# Patient Record
Sex: Male | Born: 1950 | Race: White | Hispanic: No | Smoking: Current every day smoker
Health system: Southern US, Community
[De-identification: ages and names within clinical notes are randomized; demographics above are authoritative.]

## PROBLEM LIST (undated history)

## (undated) DIAGNOSIS — E785 Hyperlipidemia, unspecified: Secondary | ICD-10-CM

## (undated) DIAGNOSIS — M62838 Other muscle spasm: Secondary | ICD-10-CM

## (undated) DIAGNOSIS — R42 Dizziness and giddiness: Secondary | ICD-10-CM

## (undated) DIAGNOSIS — Z720 Tobacco use: Secondary | ICD-10-CM

## (undated) DIAGNOSIS — J449 Chronic obstructive pulmonary disease, unspecified: Secondary | ICD-10-CM

## (undated) DIAGNOSIS — J302 Other seasonal allergic rhinitis: Secondary | ICD-10-CM

## (undated) DIAGNOSIS — I639 Cerebral infarction, unspecified: Secondary | ICD-10-CM

## (undated) DIAGNOSIS — K219 Gastro-esophageal reflux disease without esophagitis: Secondary | ICD-10-CM

## (undated) DIAGNOSIS — I2510 Atherosclerotic heart disease of native coronary artery without angina pectoris: Secondary | ICD-10-CM

## (undated) DIAGNOSIS — IMO0001 Reserved for inherently not codable concepts without codable children: Secondary | ICD-10-CM

## (undated) DIAGNOSIS — R05 Cough: Secondary | ICD-10-CM

## (undated) DIAGNOSIS — I10 Essential (primary) hypertension: Secondary | ICD-10-CM

## (undated) DIAGNOSIS — I219 Acute myocardial infarction, unspecified: Secondary | ICD-10-CM

## (undated) DIAGNOSIS — F32A Depression, unspecified: Secondary | ICD-10-CM

## (undated) DIAGNOSIS — R600 Localized edema: Secondary | ICD-10-CM

## (undated) DIAGNOSIS — C801 Malignant (primary) neoplasm, unspecified: Secondary | ICD-10-CM

## (undated) DIAGNOSIS — F1010 Alcohol abuse, uncomplicated: Secondary | ICD-10-CM

## (undated) DIAGNOSIS — F419 Anxiety disorder, unspecified: Secondary | ICD-10-CM

## (undated) DIAGNOSIS — I739 Peripheral vascular disease, unspecified: Secondary | ICD-10-CM

## (undated) DIAGNOSIS — R059 Cough, unspecified: Secondary | ICD-10-CM

## (undated) DIAGNOSIS — F329 Major depressive disorder, single episode, unspecified: Secondary | ICD-10-CM

## (undated) HISTORY — PX: OTHER SURGICAL HISTORY: SHX169

## (undated) HISTORY — PX: CAROTID ENDARTERECTOMY: SUR193

## (undated) HISTORY — PX: CORONARY ARTERY BYPASS GRAFT: SHX141

## (undated) HISTORY — PX: CARDIAC SURGERY: SHX584

---

## 1998-02-14 ENCOUNTER — Emergency Department (HOSPITAL_COMMUNITY): Admission: EM | Admit: 1998-02-14 | Discharge: 1998-02-14 | Payer: Self-pay | Admitting: Emergency Medicine

## 1998-03-14 ENCOUNTER — Inpatient Hospital Stay (HOSPITAL_COMMUNITY): Admission: EM | Admit: 1998-03-14 | Discharge: 1998-03-22 | Payer: Self-pay | Admitting: Emergency Medicine

## 1998-03-14 ENCOUNTER — Encounter: Payer: Self-pay | Admitting: Emergency Medicine

## 1998-03-19 ENCOUNTER — Encounter: Payer: Self-pay | Admitting: Cardiothoracic Surgery

## 1998-03-20 ENCOUNTER — Encounter: Payer: Self-pay | Admitting: Cardiothoracic Surgery

## 1998-03-21 ENCOUNTER — Encounter: Payer: Self-pay | Admitting: Cardiothoracic Surgery

## 1998-04-01 ENCOUNTER — Encounter: Admission: RE | Admit: 1998-04-01 | Discharge: 1998-04-01 | Payer: Self-pay | Admitting: Sports Medicine

## 1998-05-06 ENCOUNTER — Encounter: Admission: RE | Admit: 1998-05-06 | Discharge: 1998-05-06 | Payer: Self-pay | Admitting: Sports Medicine

## 1998-05-14 ENCOUNTER — Encounter: Payer: Self-pay | Admitting: Emergency Medicine

## 1998-05-14 ENCOUNTER — Emergency Department (HOSPITAL_COMMUNITY): Admission: EM | Admit: 1998-05-14 | Discharge: 1998-05-14 | Payer: Self-pay | Admitting: Emergency Medicine

## 1998-05-19 ENCOUNTER — Emergency Department (HOSPITAL_COMMUNITY): Admission: EM | Admit: 1998-05-19 | Discharge: 1998-05-19 | Payer: Self-pay | Admitting: Emergency Medicine

## 1998-08-24 ENCOUNTER — Emergency Department (HOSPITAL_COMMUNITY): Admission: EM | Admit: 1998-08-24 | Discharge: 1998-08-24 | Payer: Self-pay | Admitting: *Deleted

## 1998-09-04 ENCOUNTER — Encounter: Admission: RE | Admit: 1998-09-04 | Discharge: 1998-09-04 | Payer: Self-pay | Admitting: Family Medicine

## 1998-10-03 ENCOUNTER — Encounter: Admission: RE | Admit: 1998-10-03 | Discharge: 1998-10-03 | Payer: Self-pay | Admitting: Family Medicine

## 1998-12-24 ENCOUNTER — Encounter: Admission: RE | Admit: 1998-12-24 | Discharge: 1998-12-24 | Payer: Self-pay | Admitting: Family Medicine

## 1999-04-06 ENCOUNTER — Encounter: Admission: RE | Admit: 1999-04-06 | Discharge: 1999-04-06 | Payer: Self-pay | Admitting: Family Medicine

## 1999-08-02 ENCOUNTER — Emergency Department (HOSPITAL_COMMUNITY): Admission: EM | Admit: 1999-08-02 | Discharge: 1999-08-02 | Payer: Self-pay | Admitting: Emergency Medicine

## 1999-08-02 ENCOUNTER — Encounter: Payer: Self-pay | Admitting: Emergency Medicine

## 2001-01-09 ENCOUNTER — Emergency Department (HOSPITAL_COMMUNITY): Admission: EM | Admit: 2001-01-09 | Discharge: 2001-01-09 | Payer: Self-pay | Admitting: Emergency Medicine

## 2002-01-23 ENCOUNTER — Encounter (INDEPENDENT_AMBULATORY_CARE_PROVIDER_SITE_OTHER): Payer: Self-pay | Admitting: Specialist

## 2002-01-23 ENCOUNTER — Encounter: Payer: Self-pay | Admitting: Emergency Medicine

## 2002-01-23 ENCOUNTER — Inpatient Hospital Stay (HOSPITAL_COMMUNITY): Admission: EM | Admit: 2002-01-23 | Discharge: 2002-01-30 | Payer: Self-pay | Admitting: Emergency Medicine

## 2002-01-23 ENCOUNTER — Encounter: Payer: Self-pay | Admitting: Family Medicine

## 2002-01-24 ENCOUNTER — Encounter (INDEPENDENT_AMBULATORY_CARE_PROVIDER_SITE_OTHER): Payer: Self-pay | Admitting: Cardiology

## 2002-07-09 ENCOUNTER — Emergency Department (HOSPITAL_COMMUNITY): Admission: EM | Admit: 2002-07-09 | Discharge: 2002-07-09 | Payer: Self-pay | Admitting: *Deleted

## 2003-10-26 ENCOUNTER — Ambulatory Visit (HOSPITAL_COMMUNITY): Admission: RE | Admit: 2003-10-26 | Discharge: 2003-10-26 | Payer: Self-pay | Admitting: Family Medicine

## 2004-03-03 ENCOUNTER — Emergency Department (HOSPITAL_COMMUNITY): Admission: EM | Admit: 2004-03-03 | Discharge: 2004-03-03 | Payer: Self-pay | Admitting: Emergency Medicine

## 2004-12-05 ENCOUNTER — Emergency Department (HOSPITAL_COMMUNITY): Admission: EM | Admit: 2004-12-05 | Discharge: 2004-12-05 | Payer: Self-pay | Admitting: Emergency Medicine

## 2005-04-21 ENCOUNTER — Emergency Department (HOSPITAL_COMMUNITY): Admission: EM | Admit: 2005-04-21 | Discharge: 2005-04-21 | Payer: Self-pay | Admitting: Emergency Medicine

## 2005-06-12 ENCOUNTER — Emergency Department (HOSPITAL_COMMUNITY): Admission: EM | Admit: 2005-06-12 | Discharge: 2005-06-12 | Payer: Self-pay | Admitting: Emergency Medicine

## 2005-10-06 ENCOUNTER — Encounter: Payer: Self-pay | Admitting: Family Medicine

## 2005-10-06 ENCOUNTER — Encounter: Payer: Self-pay | Admitting: Cardiothoracic Surgery

## 2005-10-06 ENCOUNTER — Encounter: Payer: Self-pay | Admitting: Cardiology

## 2005-12-16 ENCOUNTER — Inpatient Hospital Stay (HOSPITAL_COMMUNITY): Admission: EM | Admit: 2005-12-16 | Discharge: 2005-12-17 | Payer: Self-pay | Admitting: Internal Medicine

## 2006-05-28 ENCOUNTER — Emergency Department (HOSPITAL_COMMUNITY): Admission: EM | Admit: 2006-05-28 | Discharge: 2006-05-28 | Payer: Self-pay | Admitting: Emergency Medicine

## 2006-07-10 ENCOUNTER — Emergency Department (HOSPITAL_COMMUNITY): Admission: EM | Admit: 2006-07-10 | Discharge: 2006-07-11 | Payer: Self-pay | Admitting: Emergency Medicine

## 2007-12-15 ENCOUNTER — Emergency Department (HOSPITAL_COMMUNITY): Admission: EM | Admit: 2007-12-15 | Discharge: 2007-12-15 | Payer: Self-pay | Admitting: Emergency Medicine

## 2007-12-18 ENCOUNTER — Inpatient Hospital Stay (HOSPITAL_COMMUNITY): Admission: EM | Admit: 2007-12-18 | Discharge: 2007-12-21 | Payer: Self-pay | Admitting: Emergency Medicine

## 2007-12-19 ENCOUNTER — Encounter (INDEPENDENT_AMBULATORY_CARE_PROVIDER_SITE_OTHER): Payer: Self-pay | Admitting: Internal Medicine

## 2007-12-19 ENCOUNTER — Ambulatory Visit: Payer: Self-pay | Admitting: Vascular Surgery

## 2007-12-20 ENCOUNTER — Ambulatory Visit: Payer: Self-pay | Admitting: Physical Medicine & Rehabilitation

## 2007-12-21 ENCOUNTER — Ambulatory Visit: Payer: Self-pay | Admitting: Physical Medicine & Rehabilitation

## 2007-12-21 ENCOUNTER — Inpatient Hospital Stay (HOSPITAL_COMMUNITY)
Admission: RE | Admit: 2007-12-21 | Discharge: 2008-01-09 | Payer: Self-pay | Admitting: Physical Medicine & Rehabilitation

## 2008-02-02 ENCOUNTER — Emergency Department (HOSPITAL_COMMUNITY): Admission: EM | Admit: 2008-02-02 | Discharge: 2008-02-02 | Payer: Self-pay | Admitting: Emergency Medicine

## 2008-02-19 ENCOUNTER — Encounter
Admission: RE | Admit: 2008-02-19 | Discharge: 2008-03-18 | Payer: Self-pay | Admitting: Physical Medicine & Rehabilitation

## 2008-03-13 ENCOUNTER — Encounter
Admission: RE | Admit: 2008-03-13 | Discharge: 2008-03-14 | Payer: Self-pay | Admitting: Physical Medicine & Rehabilitation

## 2008-03-14 ENCOUNTER — Ambulatory Visit: Payer: Self-pay | Admitting: Physical Medicine & Rehabilitation

## 2008-04-30 ENCOUNTER — Emergency Department (HOSPITAL_COMMUNITY): Admission: EM | Admit: 2008-04-30 | Discharge: 2008-04-30 | Payer: Self-pay

## 2008-05-01 ENCOUNTER — Emergency Department (HOSPITAL_COMMUNITY): Admission: EM | Admit: 2008-05-01 | Discharge: 2008-05-01 | Payer: Self-pay | Admitting: Emergency Medicine

## 2008-05-02 ENCOUNTER — Other Ambulatory Visit: Payer: Self-pay

## 2008-05-02 ENCOUNTER — Other Ambulatory Visit: Payer: Self-pay | Admitting: Emergency Medicine

## 2008-05-03 ENCOUNTER — Inpatient Hospital Stay (HOSPITAL_COMMUNITY): Admission: RE | Admit: 2008-05-03 | Discharge: 2008-05-10 | Payer: Self-pay | Admitting: Psychiatry

## 2008-05-03 ENCOUNTER — Ambulatory Visit: Payer: Self-pay | Admitting: Psychiatry

## 2008-06-09 ENCOUNTER — Emergency Department (HOSPITAL_COMMUNITY): Admission: EM | Admit: 2008-06-09 | Discharge: 2008-06-09 | Payer: Self-pay | Admitting: Emergency Medicine

## 2008-07-07 ENCOUNTER — Emergency Department (HOSPITAL_COMMUNITY): Admission: EM | Admit: 2008-07-07 | Discharge: 2008-07-07 | Payer: Self-pay | Admitting: Emergency Medicine

## 2008-11-15 ENCOUNTER — Emergency Department (HOSPITAL_BASED_OUTPATIENT_CLINIC_OR_DEPARTMENT_OTHER): Admission: EM | Admit: 2008-11-15 | Discharge: 2008-11-15 | Payer: Self-pay | Admitting: Emergency Medicine

## 2008-11-15 ENCOUNTER — Ambulatory Visit: Payer: Self-pay | Admitting: Diagnostic Radiology

## 2008-12-12 ENCOUNTER — Ambulatory Visit: Payer: Self-pay | Admitting: Diagnostic Radiology

## 2008-12-12 ENCOUNTER — Encounter: Payer: Self-pay | Admitting: Emergency Medicine

## 2008-12-13 ENCOUNTER — Ambulatory Visit: Payer: Self-pay | Admitting: Family Medicine

## 2008-12-13 ENCOUNTER — Observation Stay (HOSPITAL_COMMUNITY): Admission: EM | Admit: 2008-12-13 | Discharge: 2008-12-16 | Payer: Self-pay | Admitting: Family Medicine

## 2008-12-17 ENCOUNTER — Inpatient Hospital Stay (HOSPITAL_COMMUNITY): Admission: RE | Admit: 2008-12-17 | Discharge: 2008-12-23 | Payer: Self-pay | Admitting: Psychiatry

## 2008-12-17 ENCOUNTER — Ambulatory Visit: Payer: Self-pay | Admitting: Psychiatry

## 2009-03-18 ENCOUNTER — Inpatient Hospital Stay (HOSPITAL_COMMUNITY): Admission: EM | Admit: 2009-03-18 | Discharge: 2009-03-23 | Payer: Self-pay | Admitting: Emergency Medicine

## 2009-03-22 ENCOUNTER — Ambulatory Visit: Payer: Self-pay | Admitting: Psychiatry

## 2009-03-23 ENCOUNTER — Inpatient Hospital Stay (HOSPITAL_COMMUNITY): Admission: RE | Admit: 2009-03-23 | Discharge: 2009-03-26 | Payer: Self-pay | Admitting: Psychiatry

## 2010-06-28 ENCOUNTER — Encounter: Payer: Self-pay | Admitting: Internal Medicine

## 2010-09-10 LAB — CBC
HCT: 40.8 % (ref 39.0–52.0)
HCT: 41.5 % (ref 39.0–52.0)
HCT: 44.1 % (ref 39.0–52.0)
Hemoglobin: 14 g/dL (ref 13.0–17.0)
Hemoglobin: 14.3 g/dL (ref 13.0–17.0)
Platelets: 239 10*3/uL (ref 150–400)
Platelets: 266 10*3/uL (ref 150–400)
RBC: 4.2 MIL/uL — ABNORMAL LOW (ref 4.22–5.81)
RDW: 14.1 % (ref 11.5–15.5)
WBC: 5.9 10*3/uL (ref 4.0–10.5)
WBC: 6.1 10*3/uL (ref 4.0–10.5)

## 2010-09-10 LAB — GLUCOSE, CAPILLARY
Glucose-Capillary: 101 mg/dL — ABNORMAL HIGH (ref 70–99)
Glucose-Capillary: 120 mg/dL — ABNORMAL HIGH (ref 70–99)
Glucose-Capillary: 138 mg/dL — ABNORMAL HIGH (ref 70–99)
Glucose-Capillary: 157 mg/dL — ABNORMAL HIGH (ref 70–99)
Glucose-Capillary: 106 mg/dL — ABNORMAL HIGH (ref 70–99)
Glucose-Capillary: 131 mg/dL — ABNORMAL HIGH (ref 70–99)
Glucose-Capillary: 135 mg/dL — ABNORMAL HIGH (ref 70–99)
Glucose-Capillary: 182 mg/dL — ABNORMAL HIGH (ref 70–99)
Glucose-Capillary: 112 mg/dL — ABNORMAL HIGH (ref 70–99)
Glucose-Capillary: 116 mg/dL — ABNORMAL HIGH (ref 70–99)
Glucose-Capillary: 140 mg/dL — ABNORMAL HIGH (ref 70–99)
Glucose-Capillary: 98 mg/dL (ref 70–99)
Glucose-Capillary: 99 mg/dL (ref 70–99)

## 2010-09-10 LAB — DIFFERENTIAL
Basophils Relative: 1 % (ref 0–1)
Basophils Relative: 1 % (ref 0–1)
Eosinophils Relative: 2 % (ref 0–5)
Lymphocytes Relative: 35 % (ref 12–46)
Monocytes Absolute: 0.5 10*3/uL (ref 0.1–1.0)
Monocytes Absolute: 0.5 10*3/uL (ref 0.1–1.0)
Monocytes Relative: 7 % (ref 3–12)
Monocytes Relative: 8 % (ref 3–12)
Neutro Abs: 3.7 10*3/uL (ref 1.7–7.7)
Neutro Abs: 4.3 10*3/uL (ref 1.7–7.7)

## 2010-09-10 LAB — LIPID PANEL
Cholesterol: 165 mg/dL (ref 0–200)
HDL: 27 mg/dL — ABNORMAL LOW (ref >39)
Triglycerides: 553 mg/dL — ABNORMAL HIGH (ref <150)

## 2010-09-10 LAB — RAPID URINE DRUG SCREEN, HOSP PERFORMED
Amphetamines: NOT DETECTED
Tetrahydrocannabinol: NOT DETECTED

## 2010-09-10 LAB — BASIC METABOLIC PANEL
BUN: 7 mg/dL (ref 6–23)
CO2: 29 mEq/L (ref 19–32)
CO2: 33 mEq/L — ABNORMAL HIGH (ref 19–32)
GFR calc non Af Amer: >60 mL/min (ref >60)
GFR calc non Af Amer: >60 mL/min (ref >60)
Glucose, Bld: 105 mg/dL — ABNORMAL HIGH (ref 70–99)
Glucose, Bld: 116 mg/dL — ABNORMAL HIGH (ref 70–99)
Potassium: 3.9 mEq/L (ref 3.5–5.1)
Potassium: 4.2 mEq/L (ref 3.5–5.1)
Sodium: 139 mEq/L (ref 135–145)

## 2010-09-10 LAB — URINALYSIS, DIPSTICK ONLY
Glucose, UA: NEGATIVE mg/dL
Glucose, UA: NEGATIVE mg/dL
Hgb urine dipstick: NEGATIVE
Leukocytes, UA: NEGATIVE
Protein, ur: NEGATIVE mg/dL
Specific Gravity, Urine: 1.009 (ref 1.005–1.030)
Specific Gravity, Urine: 1.014 (ref 1.005–1.030)
pH: 5 (ref 5.0–8.0)
pH: 5 (ref 5.0–8.0)

## 2010-09-10 LAB — COMPREHENSIVE METABOLIC PANEL
AST: 20 U/L (ref 0–37)
AST: 20 U/L (ref 0–37)
Albumin: 3.7 g/dL (ref 3.5–5.2)
Albumin: 3.8 g/dL (ref 3.5–5.2)
Alkaline Phosphatase: 70 U/L (ref 39–117)
BUN: 10 mg/dL (ref 6–23)
Chloride: 98 mEq/L (ref 96–112)
Creatinine, Ser: 0.88 mg/dL (ref 0.4–1.5)
GFR calc Af Amer: >60 mL/min (ref >60)
GFR calc Af Amer: >60 mL/min (ref >60)
Potassium: 3.7 mEq/L (ref 3.5–5.1)
Total Bilirubin: 0.5 mg/dL (ref 0.3–1.2)
Total Protein: 7.3 g/dL (ref 6.0–8.3)

## 2010-09-10 LAB — URINALYSIS, ROUTINE W REFLEX MICROSCOPIC
Hgb urine dipstick: NEGATIVE
Nitrite: NEGATIVE
Protein, ur: NEGATIVE mg/dL
Specific Gravity, Urine: 1.01 (ref 1.005–1.030)
Urobilinogen, UA: 0.2 mg/dL (ref 0.0–1.0)

## 2010-09-10 LAB — CK TOTAL AND CKMB (NOT AT ARMC): Relative Index: 1.1 (ref 0.0–2.5)

## 2010-09-10 LAB — TRICYCLICS SCREEN, URINE: TCA Scrn: NOT DETECTED

## 2010-09-10 LAB — BLOOD GAS, ARTERIAL
Bicarbonate: 23.5 mEq/L (ref 20.0–24.0)
FIO2: 0.21 %
pH, Arterial: 7.37 (ref 7.350–7.450)
pO2, Arterial: 68 mmHg — ABNORMAL LOW (ref 80.0–100.0)

## 2010-09-10 LAB — TSH: TSH: 0.434 u[IU]/mL (ref 0.350–4.500)

## 2010-09-10 LAB — CARDIAC PANEL(CRET KIN+CKTOT+MB+TROPI)
CK, MB: 2 ng/mL (ref 0.3–4.0)
Relative Index: 0.7 (ref 0.0–2.5)
Relative Index: 0.8 (ref 0.0–2.5)

## 2010-09-10 LAB — SALICYLATE LEVEL
Salicylate Lvl: 20 mg/dL (ref 2.8–20.0)
Salicylate Lvl: 30.3 mg/dL (ref 2.8–20.0)
Salicylate Lvl: 40.9 mg/dL (ref 2.8–20.0)
Salicylate Lvl: 51 mg/dL (ref 2.8–20.0)

## 2010-09-10 LAB — ETHANOL: Alcohol, Ethyl (B): 92 mg/dL — ABNORMAL HIGH (ref 0–10)

## 2010-09-13 LAB — GLUCOSE, CAPILLARY
Glucose-Capillary: 100 mg/dL — ABNORMAL HIGH (ref 70–99)
Glucose-Capillary: 102 mg/dL — ABNORMAL HIGH (ref 70–99)
Glucose-Capillary: 104 mg/dL — ABNORMAL HIGH (ref 70–99)
Glucose-Capillary: 108 mg/dL — ABNORMAL HIGH (ref 70–99)
Glucose-Capillary: 129 mg/dL — ABNORMAL HIGH (ref 70–99)
Glucose-Capillary: 140 mg/dL — ABNORMAL HIGH (ref 70–99)
Glucose-Capillary: 141 mg/dL — ABNORMAL HIGH (ref 70–99)
Glucose-Capillary: 216 mg/dL — ABNORMAL HIGH (ref 70–99)
Glucose-Capillary: 103 mg/dL — ABNORMAL HIGH (ref 70–99)
Glucose-Capillary: 106 mg/dL — ABNORMAL HIGH (ref 70–99)
Glucose-Capillary: 113 mg/dL — ABNORMAL HIGH (ref 70–99)
Glucose-Capillary: 119 mg/dL — ABNORMAL HIGH (ref 70–99)
Glucose-Capillary: 128 mg/dL — ABNORMAL HIGH (ref 70–99)
Glucose-Capillary: 164 mg/dL — ABNORMAL HIGH (ref 70–99)
Glucose-Capillary: 99 mg/dL (ref 70–99)
Glucose-Capillary: 82 mg/dL (ref 70–99)

## 2010-09-13 LAB — URINALYSIS, ROUTINE W REFLEX MICROSCOPIC
Glucose, UA: NEGATIVE mg/dL
Hgb urine dipstick: NEGATIVE
Ketones, ur: 15 mg/dL — AB
Nitrite: NEGATIVE
Protein, ur: NEGATIVE mg/dL
Specific Gravity, Urine: 1.028 (ref 1.005–1.030)
Urobilinogen, UA: 1 mg/dL (ref 0.0–1.0)
pH: 5 (ref 5.0–8.0)

## 2010-09-13 LAB — CBC
HCT: 43 % (ref 39.0–52.0)
Hemoglobin: 14.3 g/dL (ref 13.0–17.0)
MCHC: 33.4 g/dL (ref 30.0–36.0)
MCV: 95.8 fL (ref 78.0–100.0)
Platelets: 223 10*3/uL (ref 150–400)
RBC: 4.48 MIL/uL (ref 4.22–5.81)
RDW: 12.3 % (ref 11.5–15.5)
WBC: 7.6 10*3/uL (ref 4.0–10.5)

## 2010-09-13 LAB — POCT TOXICOLOGY PANEL
Barbiturates: POSITIVE
Benzodiazepines: POSITIVE

## 2010-09-13 LAB — POCT CARDIAC MARKERS
CKMB, poc: 8.1 ng/mL (ref 1.0–8.0)
Myoglobin, poc: 350 ng/mL (ref 12–200)
Troponin i, poc: <0.05 ng/mL (ref 0.00–0.09)

## 2010-09-13 LAB — CK TOTAL AND CKMB (NOT AT ARMC)
CK, MB: 14.2 ng/mL — ABNORMAL HIGH (ref 0.3–4.0)
Relative Index: 3.5 — ABNORMAL HIGH (ref 0.0–2.5)
Total CK: 411 U/L — ABNORMAL HIGH (ref 7–232)

## 2010-09-13 LAB — URINE CULTURE: Culture: NO GROWTH

## 2010-09-13 LAB — COMPREHENSIVE METABOLIC PANEL
Albumin: 3.9 g/dL (ref 3.5–5.2)
Alkaline Phosphatase: 84 U/L (ref 39–117)
BUN: 20 mg/dL (ref 6–23)
Potassium: 4.7 mEq/L (ref 3.5–5.1)
Sodium: 134 mEq/L — ABNORMAL LOW (ref 135–145)
Total Protein: 7 g/dL (ref 6.0–8.3)

## 2010-09-13 LAB — ETHANOL: Alcohol, Ethyl (B): <5 mg/dL (ref 0–10)

## 2010-09-14 LAB — CARDIAC PANEL(CRET KIN+CKTOT+MB+TROPI)
CK, MB: 12.5 ng/mL — ABNORMAL HIGH (ref 0.3–4.0)
CK, MB: 8.6 ng/mL — ABNORMAL HIGH (ref 0.3–4.0)
Relative Index: 2.8 — ABNORMAL HIGH (ref 0.0–2.5)
Total CK: 419 U/L — ABNORMAL HIGH (ref 7–232)
Troponin I: 0.02 ng/mL (ref 0.00–0.06)
Troponin I: 0.02 ng/mL (ref 0.00–0.06)
Troponin I: 0.02 ng/mL (ref 0.00–0.06)

## 2010-09-14 LAB — CBC
HCT: 46.8 % (ref 39.0–52.0)
Hemoglobin: 15.9 g/dL (ref 13.0–17.0)
Platelets: 134 10*3/uL — ABNORMAL LOW (ref 150–400)
RDW: 13.6 % (ref 11.5–15.5)
WBC: 4.8 10*3/uL (ref 4.0–10.5)

## 2010-09-14 LAB — POCT TOXICOLOGY PANEL: Benzodiazepines: POSITIVE

## 2010-09-14 LAB — GLUCOSE, CAPILLARY
Glucose-Capillary: 102 mg/dL — ABNORMAL HIGH (ref 70–99)
Glucose-Capillary: 105 mg/dL — ABNORMAL HIGH (ref 70–99)
Glucose-Capillary: 106 mg/dL — ABNORMAL HIGH (ref 70–99)
Glucose-Capillary: 113 mg/dL — ABNORMAL HIGH (ref 70–99)
Glucose-Capillary: 149 mg/dL — ABNORMAL HIGH (ref 70–99)
Glucose-Capillary: 156 mg/dL — ABNORMAL HIGH (ref 70–99)
Glucose-Capillary: 86 mg/dL (ref 70–99)
Glucose-Capillary: 89 mg/dL (ref 70–99)
Glucose-Capillary: 91 mg/dL (ref 70–99)
Glucose-Capillary: 91 mg/dL (ref 70–99)

## 2010-09-14 LAB — URINALYSIS, ROUTINE W REFLEX MICROSCOPIC
Glucose, UA: NEGATIVE mg/dL
Nitrite: NEGATIVE
Specific Gravity, Urine: 1.007 (ref 1.005–1.030)
pH: 5.5 (ref 5.0–8.0)

## 2010-09-14 LAB — DIFFERENTIAL
Basophils Absolute: 0 10*3/uL (ref 0.0–0.1)
Lymphocytes Relative: 27 % (ref 12–46)
Lymphs Abs: 1.3 10*3/uL (ref 0.7–4.0)
Neutro Abs: 3.1 10*3/uL (ref 1.7–7.7)

## 2010-09-14 LAB — BASIC METABOLIC PANEL
BUN: 16 mg/dL (ref 6–23)
CO2: 24 mEq/L (ref 19–32)
Calcium: 9.2 mg/dL (ref 8.4–10.5)
Chloride: 103 mEq/L (ref 96–112)
GFR calc Af Amer: >60 mL/min (ref >60)
GFR calc non Af Amer: >60 mL/min (ref >60)
Glucose, Bld: 80 mg/dL (ref 70–99)
Potassium: 3.6 mEq/L (ref 3.5–5.1)
Potassium: 3.8 mEq/L (ref 3.5–5.1)
Sodium: 139 mEq/L (ref 135–145)
Sodium: 140 mEq/L (ref 135–145)

## 2010-09-14 LAB — HEMOGLOBIN A1C
Hgb A1c MFr Bld: 5.1 % (ref 4.6–6.1)
Mean Plasma Glucose: 100 mg/dL

## 2010-09-14 LAB — AMMONIA: Ammonia: 40 umol/L — ABNORMAL HIGH (ref 11–35)

## 2010-09-14 LAB — ETHANOL: Alcohol, Ethyl (B): <5 mg/dL (ref 0–10)

## 2010-09-21 LAB — DIFFERENTIAL
Basophils Relative: 0 % (ref 0–1)
Eosinophils Relative: 4 % (ref 0–5)
Lymphs Abs: 1.7 10*3/uL (ref 0.7–4.0)
Monocytes Absolute: 0.4 10*3/uL (ref 0.1–1.0)
Monocytes Relative: 10 % (ref 3–12)
Monocytes Relative: 8 % (ref 3–12)
Neutro Abs: 2.6 10*3/uL (ref 1.7–7.7)
Neutro Abs: 4.1 10*3/uL (ref 1.7–7.7)
Neutrophils Relative %: 62 % (ref 43–77)

## 2010-09-21 LAB — URINALYSIS, ROUTINE W REFLEX MICROSCOPIC
Protein, ur: NEGATIVE mg/dL
Urobilinogen, UA: 1 mg/dL (ref 0.0–1.0)

## 2010-09-21 LAB — URINE CULTURE

## 2010-09-21 LAB — CBC
HCT: 47 % (ref 39.0–52.0)
HCT: 48.3 % (ref 39.0–52.0)
Hemoglobin: 15.7 g/dL (ref 13.0–17.0)
Hemoglobin: 16.3 g/dL (ref 13.0–17.0)
MCHC: 33.5 g/dL (ref 30.0–36.0)
MCHC: 33.6 g/dL (ref 30.0–36.0)
RBC: 4.94 MIL/uL (ref 4.22–5.81)
RDW: 13 % (ref 11.5–15.5)

## 2010-09-21 LAB — COMPREHENSIVE METABOLIC PANEL
BUN: 10 mg/dL (ref 6–23)
Calcium: 10 mg/dL (ref 8.4–10.5)
Glucose, Bld: 100 mg/dL — ABNORMAL HIGH (ref 70–99)
Sodium: 140 mEq/L (ref 135–145)
Total Protein: 6.9 g/dL (ref 6.0–8.3)

## 2010-09-21 LAB — BASIC METABOLIC PANEL
CO2: 21 mEq/L (ref 19–32)
Calcium: 8.6 mg/dL (ref 8.4–10.5)
Chloride: 94 mEq/L — ABNORMAL LOW (ref 96–112)
GFR calc Af Amer: >60 mL/min (ref >60)
Potassium: 3.7 mEq/L (ref 3.5–5.1)
Sodium: 131 mEq/L — ABNORMAL LOW (ref 135–145)

## 2010-09-21 LAB — POCT CARDIAC MARKERS
CKMB, poc: <1 ng/mL — ABNORMAL LOW (ref 1.0–8.0)
Myoglobin, poc: 34.4 ng/mL (ref 12–200)
Troponin i, poc: <0.05 ng/mL (ref 0.00–0.09)

## 2010-10-20 NOTE — Discharge Summary (Signed)
NAME:  Melvin, [name] NO.:  [id number]   MEDICAL RECORD NO.:  [id number]          PATIENT TYPE:  IPS   LOCATION:  4030                         FACILITY:  [facility]   PHYSICIAN:  Ellwood Dense, M.D.   DATE OF BIRTH:  05-28-1951   DATE OF ADMISSION:  12/21/2007  DATE OF DISCHARGE:  01/09/2008                               DISCHARGE SUMMARY   DISCHARGE DIAGNOSES:  1. Left middle cerebral artery infarct.  2. Hypertension.  3. History of alcohol abuse.  4. Anxiety disorder.  5. History of depression.  6. Polycythemia.   HISTORY OF PRESENT ILLNESS:  [name] is a 60 year old male with  history of coronary artery disease, CVA in 2003 with minimal right  hemiparesis residually.  He was admitted on December 18, 2007, with 4-day  history of worsening right-sided weakness and difficulty with mobility.  MRI, MRA of brain showed acute infarct left MCA affecting left parietal,  left frontal, left caudate nucleus, right ICA occlusion, and severe  stenosis in left ICA with severe intracranial atherosclerosis.  A 2-D  echo showed moderate dilatation of right atrium, no thrombus, EF of 55-  65%.  Carotid Doppler showed right severe calcific plaque and mild left  ICA calcific plaque.  Plavix was added for CVA prophylaxis.  The patient  was noted to have issues with uncontrolled blood sugars and Lantus was  initiated.  Hemoglobin A1c was noted be at 8.8 at admission.  The  patient was also noted to have dyslipidemia with cholesterol of 193,  triglycerides 461, HDL 21, and LDL incalculable.  Homocystine levels  were noted to be at 11.0.  The patient continued with right lower  greater than right upper extremity weakness and decreased sensation in  the right lower extremity and facial weakness.  Therapies were  initiated, and the patient was noted to have impairments in mobility and  self-care and rehab consult for further therapies.   PAST MEDICAL HISTORY:  Significant for:  1.  Coronary artery disease with CABG in 1999.  2. Dyslipidemia.  3. Anxiety disorder.  4. Depression.  5. Left CVA with mild right hemiparesis in 2003.  6. History of polycythemia.   ALLERGIES:  PENICILLIN.   FAMILY HISTORY:  Positive for coronary artery disease.   SOCIAL HISTORY:  The patient is widowed, lives with brother in one-level  home with two steps at entry.  He is disabled secondary to coronary  artery disease.  He has 1-pack per day tobacco use history.  Alcohol  reportedly 12 beers a day.   FUNCTIONAL HISTORY:  The patient was independent without assistive  device prior to admission.   FUNCTIONAL STATUS:  The patient was requiring max assist to transfer  with verbal cues for safety and sequencing, nonambulatory secondary to  right lower extremity weakness.  Min to max assist for ADLs.  Able to  use right upper extremity grossly.  Speech therapy evaluation showed  mild-to-moderate dysarthria with mild decrease in attention.   PHYSICAL EXAMINATION AT ADMISSION:  GENERAL:  Centrally obese male, in  no acute distress.  HEENT:  Head was normocephalic and  normotraumatic.  Extraocular  movements intact.  Nares patent.  Tongue midline.  CARDIOVASCULAR:  Regular rate rhythm without murmurs.  LUNGS:  Clear to auscultation bilaterally.  ABDOMEN:  Slightly obese, soft, and nontender with positive bowel  sounds.  NEUROLOGIC:  The patient was alert and oriented x3.  Cranial nerve exam  showed slight facial weakness on the right.  Tongue midline.  Left upper  and left lower extremity strength were 5/5.  Sensation was intact to  light touch in the left arm and left leg.  The right upper extremity  strength was generally -3 to 3+/5 with decreased coordination movement.  Right lower extremity strength was generally 4-/5 with decreased  coordination movement.  The patient was able to follow 1-2 step command  without difficulty.  He was noted to have decreased sensation in the  right  face.   HOSPITAL COURSE:  [name] was admitted to rehab on December 21, 2007, for inpatient therapies to consist of PT, OT, and speech therapy  at least 3 hours a day on average of 5 days a week.  During this stay,  his blood pressures were monitored on b.i.d. basis with close monitoring  for an adjustment of meds for control.  Blood pressures at the time of  discharge ranging from 140s overall to a rare high in 160 systolic and  60s-70s diastolic.  Heart rate stable in 50s-60s range.  Rehab RN has  been following the patient for pressure relief measures as well as  toileting to maintain continence for bowel and bladder training.  The  patient has had UA and UC checked past admission that was negative.  Labs checked revealed hemoglobin 17.8, hematocrit 50.9, white count is  7.1, and platelets 173.  Check of lytes revealed sodium 138, potassium  3.7, chloride 100, CO2 of 30, BUN 5, creatinine 0.7, and glucose 116.  LFTs showed SGOT 23, SGPT 18, alkaline phos 85, T bili 1.2, total  protein 6.2 and albumin 3.3.  The patient reported issues with anxiety  as well as problems with insomnia.  Initially, trazodone was added to  help with his sleep.  He had problems with this, Ambien was added  additionally.  With improvement in the patient's mobility, Ambien was  tapered off and currently the patient continues on trazodone alone  nightly with good sleep hygiene.  The patient has also had issues with  Paxil.  There was a question of issues regarding alcohol withdrawal,  elevated levels of anxiety, and some sundowning of __________ past  admission.  He was started on Ativan tape and on Xanax 1 mg p.o. t.i.d.,  this was increased to 1 mg p.o. b.i.d., with initial indication to  decrease to one per day and taper off.  The patient, however, reports  increased anxiety levels with decreased dose of Xanax and increased  anxiety levels, especially at home without Xanax.  He is encouraged to  keep  his Xanax at 1 pill b.i.d. and to discuss issues regarding addition  of another medicines as an antianxiolytic with his primary care  physician past discharge.  Currently, nicotine patch is being used for  to help assist with tobacco cessation and withdrawal symptoms.  He is to  decrease it to 14 mg daily for 3 weeks, then to 7 mg daily for 3 weeks,  and then stop.  Issues regarding alcohol cessation as well as tobacco  cessation and its impact on health were discussed both with the patient  and his family.  The patient was maintained on Lopid for dyslipidemia  during this stay.  The patient's blood sugars were checked on a.c. h.s.  basis during this stay.  He has been weaned off Lantus currently blood  sugars ranging from 80s to 140s, on Glucophage 1000 mg b.i.d.   PT, OT, speech therapy has been ongoing during this stay.  At the time  of admission, the patient was at 80% accurate for basic factual  information.  He was noted to be intimately distracted during  conversation with mild dysarthria.  Reading comprehension for paragraph  level was at 75% accuracy.  He was noted to require min cueing to  elaborate for problem solving at higher level.  At the time of  discharge, the patient's basic as well as complex level comprehension  was at 100% accuracy.  Verbal expression was 100% intelligible without  any evidence of distraction.  Reading comprehension at sentence as well  as paragraph level was within functional limits.  The patient was able  to write without difficulty.  He was able to pay attention in mild-to-  moderate distracting environment without difficulty.  He was independent  for his safety need with good awareness of his deficits.  Memory was  intact.  Executive functioning was within functional limits.  The  patient was able to reason for safety and planning to compensate for his  deficits.  At the time of admission, the patient was at [facility] assist for  mobility.  He required  total assist  +2 to ambulate 50 feet at 50% with  increase in right knee flexion and decrease in the step length.  At the  time of discharge, the patient had progressed along to being at  supervision level overall for transfers and ambulation.  Family  education was completed with the patient's daughter as well as brother  regarding safety education and to help cue patient for his occasional  right inattention.  In terms of OT, the patient had progressed along  from requiring mod assist for pericare with cueing to and assist to help  compensate for his lean to the right as well as focusing on right upper  extremity and awareness for self-care needs.  The patient was at steady  assist for shower transfers, mod independent level for sit-to-stand with  no upper extremity support.  He showed improved awareness of right upper  extremity with some ataxia with movement still present, he had increase  in proprioception and increase in sensation.  He was a modified  independent for basic squat and stand pivot transfer to wheelchair,  toilet, as well as bed.  He could ambulate to bathroom with steady  assist with safe use of rolling walker.  The family education is  completed with daughter and brother, will provide supervision past  discharge.  The patient will continue with further followup home health  PT, OT by [facility] past discharge.  On November 09, 2007, the  patient is discharged to home.   DISCHARGE MEDICATIONS:  1. Plavix 75 mg p.o. per day.  2. Foltx 1 p.o. per day.  3. Multivitamin 1 per day.  4. Paxil 20 mg a day.  5. Trazodone 50 mg nightly.  6. Xanax 1 mg p.o. b.i.d.  7. Lopressor 25 mg half p.o. b.i.d.  8. Lopid 600 mg b.i.d.  9. Coated aspirin 325 mg a day.  10.Altace 2.5 mg b.i.d.  11.Glucophage 1000 mg b.i.d.  12.Nicotine patch 15 mg, change  daily for 3 weeks and decrease to 7 mg      daily for 3 weeks, and then stop.   DIET:  Diabetic diet.   ACTIVITY LEVEL:   Intermittent supervision.  No alcohol, no smoking, and  no driving.  Walk with assistance.   SPECIAL INSTRUCTIONS:  Check blood sugars twice a day and record.  Absolutely no smoking with patch on.  Do not use glipizide or metoprolol  100 mg.  [facility] to provide PT and OT as home health  therapies.   FOLLOWUP:  The patient to follow up with Dr. Thomasena Edis on March 14, 2008,  at 11 a.m.  Follow up with Dr. Milus Glazier on February 01, 2008, at 10 a.m.       Greg Cutter, P.A.       ______________________________  Ellwood Dense, M.D.    PP/MEDQ  D:  01/09/2008  T:  01/10/2008  Job:  [id number]   cc:   Elvina Sidle, M.D.

## 2010-10-20 NOTE — Discharge Summary (Signed)
NAME:  Melvin, Krueger NO.:  [id number]   MEDICAL RECORD NO.:  [id number]          PATIENT TYPE:  INP   LOCATION:  3705                         FACILITY:  [facility]   PHYSICIAN:  Leighton Roach McDiarmid, M.D.DATE OF BIRTH:  1951/05/12   DATE OF ADMISSION:  12/13/2008  DATE OF DISCHARGE:                               DISCHARGE SUMMARY   PRIMARY CARE PHYSICIAN:  Dr. Faustino Congress at [facility].   REASON FOR ADMISSION:  Visual and auditory hallucinations, altered  mental status and hypotension.   PERTINENT ADMIT LABORATORIES:  BMP within normal limits.  CBC within  normal limits.  Cardiac enzymes within normal limits except for mild  elevation in CK total.  CK total #1 of 419, CK #2 of 307, and #3 of 223.   ADMIT STUDIES:  CT of the head showed atrophy and small vessel and  chronic ischemic changes.  No acute abnormalities.  EKG revealed normal  sinus rhythm.  No ST elevation.   PRIMARY DISCHARGE DIAGNOSES:  1. Visual and auditory hallucinations.  2. Altered mental status, now resolved.  3. Hypotension, resolved.  4. Substance abuse.  5. Elevated CK, resolved.  6. Diabetes.   HOME MEDICATIONS TO BE CONTINUED ON DISCHARGE:  1. Toprol-XL 50 mg every morning.  2. Altace 5 mg every morning.  3. Glucophage 1000 mg twice a day.  4. Lopid 600 mg at night.  5. Paxil 30 mg at night.  6. Aspirin 800 mg daily.  7. Multivitamin daily.  8. Folic acid daily.  9. Xanax 0.5 mg t.i.d.  (This is a change in dosage.  The patient's      home regimen had Xanax 1 mg t.i.d.  This was continued because of      concern for withdrawal if this medication was stopped completely      without taper.)   PROBLEMS:  1. Visual and auditory hallucinations.  The patient was taken to an      urgent care facility.  The family expressed concern over the      patient's visual and auditory hallucinations.  Specifically, the      patient was seeing dead bodies at home and talking to people who      were not  present.  Per admission note, family members were trying      to involuntarily commit patient for these concerns.  Now that the      patient is medically cleared, we are in the process of placing the      patient at a behavioral health facility.  The patient was seen      yesterday by Dr. Katrinka Blazing who declared that the patient is in need of      involuntary commitment.  2. Hypotension.  The patient's blood pressure was in the 90s on      arrival to the emergency department.  After IV fluids and      improvement of altered mental status, the blood pressure issue has      now resolved.  Blood pressure on the day of discharge was within      normal limits.  Blood pressure this morning is 133/73.  Has had      elevation up to 178/95.  For this reason, have restarted the      patient's home blood pressure medications.  Will continue to      monitor blood pressure.  3. Substance abuse.  CIWA protocol in place during hospital admission.      The CIWA protocol was not used.  No p.r.n. Ativan was needed.  No      signs or symptoms of delirium tremens.  Unsure of patient's current      use of alcohol.  The patient denies use, but family reports to      floor R.N. that he still has alcohol abuse problems.  The patient      has a nicotine patch in place.  4. Altered mental status.  On patient's arrival to the ER, the patient      was not alert and oriented x3.  He was drowsy on admission,      arousable to touch, but due to sedation would not interact with      staff.  After resting for approximately one day, the patient's      altered mental status resolved, now alert and oriented x3.      Currently appears to be at baseline.  We are unsure of cause of      altered mental status, most likely due to use of substance.  The      patient denies any overdose.  Son has concern that patient used      extra dosages of his prescription of Xanax.  There is concern that      more pills are missing from the  bottle than there should be by this      date.  The patient denies any overuse of these medications.  He      also denies alcohol use or other chemical use.  The patient at      baseline at this time, alert and oriented x3, and awaiting      placement to behavioral health.  5. Elevated CK.  CK not in range of rhabdomyolysis.  Most likely, this      is due to dehydration or chronic alcohol use.  This improved with      each cycle of cardiac enzymes.  All EKGs within normal limits.  6. Diabetes.  Unknown if prior to admission the patient was taking      prescribed medications.  Hemoglobin A1c at this time is 5.10.      During the hospital stay, has had some elevation in blood glucose      up to 166, but most glucose levels have been within normal limits.   CONDITION AT DISCHARGE:  Medically clear and awaiting placement at  [facility].      Ellin Mayhew, MD  Electronically Signed      Leighton Roach McDiarmid, M.D.  Electronically Signed    DC/MEDQ  D:  12/16/2008  T:  12/16/2008  Job:  [id number]

## 2010-10-20 NOTE — Discharge Summary (Signed)
NAME:  Melvin Krueger, Melvin Krueger NO.:  [id number]   MEDICAL RECORD NO.:  [id number]          PATIENT TYPE:  IPS   LOCATION:  4030                         FACILITY:  [facility]   PHYSICIAN:  Ellwood Dense, M.D.   DATE OF BIRTH:  05-26-1951   DATE OF ADMISSION:  12/21/2007  DATE OF DISCHARGE:                               DISCHARGE SUMMARY   PRIMARY CARE PHYSICIAN:  Dario Guardian, MD[facility] (the  patient rarely has seen her).   HISTORY OF PRESENT ILLNESS:  Mr. Chiriboga is a 60 year old Caucasian male  with history of coronary artery disease with prior CABG in 1999 along  with prior stroke in 2003 with minimal right hemiparesis as residual.   The patient was admitted on December 18, 2007 with a 4-day history of  worsening right-sided weakness and difficulty with mobility.  MRI and  MRA studies of the brain showed an acute infarct in the left middle  cerebral artery affecting the left parietal and left frontal along with  left caudate nucleus.  There was also right internal carotid artery  occlusion noted with severe stenosis of other locations.  A 2-D  echocardiogram showed moderate dilatation in the right atrium with no  thrombus.  Ejection fraction was 55-65%.  Carotid Doppler showed right  severe calcific plaque and mild left internal carotid artery calcific  plaque.  Plavix was added for stroke prophylaxis.  Blood sugars have  been uncontrolled with Lantus insulin initiated.   The patient continues with right lower extremity more than right upper  extremity weakness.  He has decreased sensation in the right lower  extremity with facial weakness.  Therapies have been ongoing with the  patient requiring max assist to transfer, with verbal cues for safety  and sequencing.  He is nonambulatory secondary to right lower extremity  weakness.  He acquires min-to-max assist for ADLs, and he is able to use  his right upper extremity grossly.  Speech therapy eval showed  moderate  dysarthria with mild decrease in attention.   The patient was evaluated by the rehabilitation physicians and felt to  be an appropriate candidate for inpatient rehabilitation.   REVIEW OF SYSTEMS:  Positive for weakness.   PAST MEDICAL HISTORY:  1. History of coronary artery disease with prior CABG in 1999.  2. Dyslipidemia.  3. Anxiety disorder.  4. Depression.  5. Left carotid endarterectomy.  6. Prior stroke with mild right hemiparesis in 2003.  7. Polycythemia.   FAMILY HISTORY:  Positive for coronary artery disease.   SOCIAL HISTORY:  The patient is widowed and lives with his brother in a  one-level unit home with two steps to enter.  He reports that his  brother came up from [place] recently and has been living with him, but  there may be plans for the brother to return to [place].  The patient is  not working at present, and his brother is also not working.  The  patient is disabled secondary to coronary artery disease.  He admits to  smoking one pack of cigarettes per day and reports variable alcohol  intake, although frequently he drinks from 12-24 beers per day.  He has  2 grown children and occasionally sees them.  [facility] had [facility] in [place] before and lost his driver's license at that time.   PAST MEDICAL HISTORY:  Prior to admission:  Independent prior admission,  but not driving.   ALLERGIES:  PENICILLIN.   MEDICATIONS:  Prior to admission:  The patient took no medications for 3  months, but previously at some point was on metoprolol 100 mg daily,  glipizide 5 mg daily, Xanax 1 mg b.i.d., and Paxil 20 mg daily along  with aspirin 325 mg daily.   LABORATORY DATA:  Most recent hemoglobin was 17.2 with hematocrit of  49.4, platelet count of 195,000, and white count of 6.8.  Most recent  sodium was 137, potassium 4.0, chloride 107, bicarbonate 28, BUN 5,  creatinine 0.59, and glucose of 114.  Recent homocystine level was 11.0  with  hemoglobin A1c of 8.8, total cholesterol of 193, triglycerides of  461, HDL cholesterol of 21, and LDL cholesterol could not be calculated.   PHYSICAL EXAMINATION:  GENERAL:  Reasonably well-appearing, middle-aged,  elderly adult male seated on the edge of bed in mild-to-no acute  discomfort.  VITAL SIGNS:  Blood pressure 119/79 with a pulse of 68, respiratory rate  17, and O2 saturation 100% on room air with temperature 98.8.  HEENT:  Normocephalic and nontraumatic.  CARDIOVASCULAR:  Regular rate and rhythm.  S1 and S2 without murmurs.  ABDOMEN:  Soft, slightly obese, and nontender with positive bowel  sounds.  LUNGS:  Clear to auscultation bilaterally.  NEUROLOGIC:  Alert and oriented x3.  Cranial nerve exam showed only  slight facial weakness on the right.  Tongue was in the midline.  Extraocular muscles appeared intact.  Left upper and left lower  extremity strengths were 5/5 throughout.  Sensation was intact to light  touch with the left arm and leg.  Right upper extremity strength was  generally 3-3+/5 with decreased coordinated movement.  Right lower  extremity strength was generally 4-/5 with decreased coordinated  movement and intact sensation.   DIAGNOSES:  Status post left middle cerebral artery infarct with right  internal carotid artery stenosis and right hemiparesis and dysarthria.   Presently, the patient has deficits in activities of daily living,  transfers, and ambulation along with higher level cognition and speech  related to the above-noted left middle cerebral artery infarct.   PLAN:  1. Admit to the Rehabilitation Unit for daily therapies to include      physical therapy for range of motion, strengthening, bed mobility,      transfers, pre-gait training, gait training, and equipment      evaluation.  2. Occupational therapy for range of motion, strengthening, activities      of daily living, cognitive/perceptual training, splinting, and      equipment  evaluation.  3. Rehab Nursing for skin care, wound care, and bowel and bladder      training as necessary.  4. Speech therapy for higher-level cognition evaluation of swallow as      necessary.  Case management to assess home environment, assist with      discharge planning, and arrange for appropriate followup care.  5. Social work to assess family and social support and assist in      discharge planning.  6. Continue high-carbohydrate modified diet.  7. Check admission labs including CBC and CMET in a.m. on December 22, 2007.  8. Sliding scale NovoLog insulin for blood sugars greater than 151.  9. Tylenol 325 mg one-to-two tablets p.o. q.4 h. p.r.n. for pain.  10.In-and-out catheters for no more than 8 hours.  CBGs a.c. and      nightly.  11.Enteric-coated aspirin 325 mg p.o. daily.  12.Lovenox 40 mg subcutaneously daily.  13.Lopid 600 mg p.o. b.i.d.  14.Lantus insulin 15 units subcutaneously nightly.  15.Lopressor 12.5 mg p.o. b.i.d.  16.Ativan 0.5 mg q.6 h.  17.Protonix 40 mg p.o. daily.  18.Paxil 20 mg p.o. daily.  19.Nicotine patch 21 mg per day, change daily.  20.Multivitamin one p.o. daily.  21.Glucophage 500 mg p.o. b.i.d.  22.Plavix 75 mg p.o. daily.  23.Thiamine 100 mg p.o. daily.  24.Toilet the patient q.4 h. while awake.  25.Folic acid 1 mg p.o. daily.  26.Altace 2.5 mg p.o. b.i.d.  27.Ambien 5 mg p.o. nightly.   Presently, the patient will be treated with aggressive therapies  included PT/OT and speech therapies at least 3 hours a day along with 24-  hour nursing for assistance in recovering from the above-noted left  middle cerebral artery infarct with right hemiparesis.   The patient also will receive weekly conference meetings to assess  status, goals, and barriers to discharge.   We will also be monitoring the patient for delirium tremens as he  recovers from heavy use of alcohol on a daily basis.  He will be treated  presently with Ativan, but we are  switching to Xanax to be used 1 mg  q.i.d. and that will be monitored with adjustment of medication as  necessary.   PROGNOSIS:  Good.   ESTIMATED LENGTH OF STAY:  10-20 days.   GOALS:  Supervision-to-min assist ADLs, transfers, and ambulation.           ______________________________  Ellwood Dense, M.D.     DC/MEDQ  D:  12/22/2007  T:  12/22/2007  Job:  [id number]

## 2010-10-20 NOTE — H&P (Signed)
NAME:  Melvin Krueger, Melvin Krueger NO.:  [id number]   MEDICAL RECORD NO.:  [id number]          PATIENT TYPE:  INP   LOCATION:  0112                         FACILITY:  [facility]   PHYSICIAN:  Lonia Blood, M.D.       DATE OF BIRTH:  1950-11-02   DATE OF ADMISSION:  12/18/2007  DATE OF DISCHARGE:                              HISTORY & PHYSICAL   PRIMARY CARE PHYSICIAN:  This patient does not have a primary care  physician.   CHIEF COMPLAINT:  Weak.   HISTORY OF PRESENT ILLNESS:  Melvin Krueger is a 60 year old gentleman with  history of diabetes, coronary artery disease, strokes, alcohol abuse and  tobacco abuse who was brought in by his family today because he was  having worsening right-sided weakness.  The patient has suffered an old  left stroke that left him with minimal right hemiparesis but according  to his family for the past 4 days he has  been having worsening right-  sided weakness to the point where he cannot stand on his own right now.  The patient has continued to abuse alcohol heavily, but it has been 48  hours since he had any drink.  Melvin Krueger is able to provide some  history, but he is a little bit confused.  His speech does not seem to  be affected.  He tells me he is right handed.   PAST MEDICAL HISTORY:  1. According to the old records, the patient suffered a left brain      stroke in 2003, and at that time he had left carotid endarterectomy      done also  2. History of diabetes mellitus type 2.  3. Coronary disease status post CABG in 1999.  4. Hyperlipidemia.  5. Anxiety and depression.  6. Hypertension.   HOME MEDICATIONS:  The patient was supposed to be an aspirin,  metoprolol, glipizide, Xanax and Paxil, but there is a possibility he  may have run out of them for about a month now.   SOCIAL HISTORY:  The patient lives with his brother.  He smokes a pack  cigarettes a day.  Drinks about 12 beers a day.  He is on disability due  to his coronary artery  disease.  He is a widower.   FAMILY HISTORY:  Positive for coronary artery disease in his father.   REVIEW OF SYSTEMS:  As per HPI.  The patient denies any chest pain,  shortness of breath.  Denies abdominal pain, nausea, vomiting. Denies  any numbness. All other systems have been reviewed and negative.   PHYSICAL EXAMINATION:  VITAL SIGNS:  Upon admission temperature is 98.4,  blood pressure 157/79, pulse 74, respirations 16, saturation 96% on room  air.  GENERAL APPEARANCE:  A centrally obese gentleman in no acute distress,  lying on stretcher.  He is alert and oriented to place, person and time.  HEENT: His head appears normocephalic, atraumatic.  Eyes: The pupils are  equal, round, reactive to light and accommodation.  Extraocular  movements are intact.  Throat is clear.  NECK: Supple.  No JVD.  CHEST: Clear without wheezes, rales, rhonchi or crackles.  HEART:  Regular rate and rhythm without murmurs, rubs or gallop.  ABDOMEN:  Soft, nontender, nondistended.  Bowel sounds are present.  EXTREMITIES:  Lower extremities are without edema.  SKIN:  Warm, dry without any suspicious-looking rashes.  NEUROLOGICAL:  The patient has intact speech, intact repetition. He has  about 2/5 weakness on the right upper extremity, and he is able to lift  the right lower extremity from the bed.  He cannot hold it the air  longer than 5 seconds. The patient does have facial asymmetry which  according to his family is worse than his chronic state. The left side  is strong 5/5. Deep tendon reflexes are decreased on the right and are  +2 on the left.  Plantars are upgoing on the right and downgoing on the  left. I did not stand the patient has according to the family he will  fall if we attempt to stand him up.   LABORATORY VALUES ON ADMISSION:  Nothing has been ordered.   RADIOGRAPHIC DATA:  There is a head CT that calls for bilateral old  strokes and a thalamic stroke.   ASSESSMENT AND PLAN:  1.  Worsening right hemiparesis in a 60 year old medically complicated      gentleman with multiple problems and hx of stroke.  My plan is to      admit Melvin Krueger to telemetry bed.  Frequently check his      neurological exam.  Obtain MRI of his brain to see if any of his      strokes, seen on his CT are subacute or acute.  I will resume the      patient's aspirin and check his transthoracic echocardiogram as      well as carotid ultrasounds.  Physical therapy and occupational      therapy consultations will be obtained..  2. Alcoholism.  Melvin Krueger is approaching the time when he will going      onto withdrawals.  According to his family  he has had bad episodes      of delirium tremens in the past.  The patient will be started on      Ativan protocol, and he will have a CIWA score assessed.  The      patient will be started on thiamine and folic acid.  3. Diabetes mellitus.  This seems to be uncontrolled.  I will obtain a      hemoglobin A1c.  As long as the patient is in-house, I will treat      him with Lantus and sliding-scale insulin and then I will try to      get him on metformin and sulfonylurea when he leaves the hospital.  4. Tobacco abuse.  The patient will be counseled, and he will be      started on nicotine patch.  5. Deep vein thrombosis prophylaxis will be done using Lovenox.      Lonia Blood, M.D.  Electronically Signed     SL/MEDQ  D:  12/18/2007  T:  12/18/2007  Job:  [id number]

## 2010-10-20 NOTE — Discharge Summary (Signed)
NAME:  Melvin Krueger, Melvin Krueger NO.:  [id number]   MEDICAL RECORD NO.:  [id number]          PATIENT TYPE:  INP   LOCATION:  1412                         FACILITY:  [facility]   PHYSICIAN:  Hillery Aldo, M.D.   DATE OF BIRTH:  1950/10/23   DATE OF ADMISSION:  12/18/2007  DATE OF DISCHARGE:  12/21/2007                               DISCHARGE SUMMARY   PRIMARY CARE PHYSICIAN:  None.   DISCHARGE DIAGNOSES:  1. Acute left parietal lobe, frontal, and caudate lobe infarcts.  2. Right internal carotid artery stenosis.  3. Alcohol abuse.  4. Dyslipidemia.  5. Uncontrolled diabetes.  6. Hypertension.  7. Anxiety.  8. Depression.  9. Tobacco abuse.   DISCHARGE MEDICATIONS:  1. Banana bag daily IV.  2. Ecotrin aspirin 325 mg daily.  3. Clonidine 0.1 mg b.i.d.  4. Plavix 75 mg daily.  5. Lopid 600 mg b.i.d.  6. Lantus insulin 15 units subcutaneously q.h.s.  7. NovoLog sliding scale insulin, insulin sensitive scale, q.a.c. and      h.s.  8. Ativan 0.5 mg q.6 h, taper proceed per protocol.  9. Metoprolol 12.5 mg b.i.d.  10.Nicotine patch 21 mg transdermally.  11.Protonix 40 mg daily.  12.Paxil 20 mg daily.  13.Ativan 0.5 mg q.2 h p.r.n.  14.Zofran 4 mg p.o. or IV q.8 h p.r.n.  15.Tylenol 650 mg q.4 h p.r.n.   CONSULTATION:  PMR.   BRIEF ADMISSION HPI:  The patient is a 60 year old male with a known  history of cerebrovascular disease who presented to the hospital with  worsening right-sided weakness.  He was admitted for further evaluation  and workup.  For the full details, please see the dictated report done  by Dr. Lavera Guise.   PROCEDURES AND DIAGNOSTIC STUDIES.:  1. CT of the head on December 18, 2007 showed old bilateral parieto-      occipital strokes, larger on the left than the right.  Old lacunar      stroke in the right thalamus.  No acute process evident.  2. MRI/MRA of the brain on December 18, 2007 showed severe intracranial      atherosclerosis.  The right internal  carotid artery was occluded      and there was a moderate-to-severe stenosis of the left internal      carotid artery in the precavernous segment.  There were acute      infarcts in the left parietal lobe, small acute infarcts in the      left frontal lobe, and in the head of caudate on the left.  These      all appeared to be within the left middle cerebral artery      territory.  There was also a question of minimal hemorrhage in the      left high parietal infarct.  Atrophy and chronic microvascular      ischemia was noted.  3. Two-dimensional echocardiogram on December 19, 2007 showed no likely      discrete intracardiac source of emboli apparent.  Left ventricular      systolic function was normal with an ejection fraction estimated  at      55-65%.  No diagnostic evidence of left ventricular regional wall      motion abnormality.  Left ventricular wall thickness was moderately      increased.  Aortic valve thickness was mildly increased.  The left      atrium was moderately dilated.  4. Carotid Dopplers on December 19, 2007 showed right severe calcific      plaque noted in the internal carotid artery.  Left mild calcific      plaque noted throughout.  Right external carotid artery stenosis.      A tubal artery flow antegrade bilaterally.  The right ICA appears      occluded.  No significant left ICA stenosis noted.   DISCHARGE LABORATORY VALUES:  Sodium was 137, potassium 4, chloride 102,  bicarb 28, BUN 5, creatinine 0.59, glucose 114.  Liver function studies  were within normal limits.  Protein was 5.4, albumin 2.9.  White blood  cell count was 6.8, hemoglobin 17.2, hematocrit 49.4, platelets 157.   HOSPITAL COURSE BY PROBLEM:  1. Acute CVA:  The patient was admitted and a diagnostic workup was      undertaken, with the findings noted above.  Because of his motor      deficits, a rehabilitation medicine consultation was requested and      the patient was found to be an appropriate  candidate for transfer      to rehab.  At this point, his risk factor profile needs better      control.  He is dyslipidemic, diabetic, hypertensive, and does have      a history of alcohol abuse, all of which raise his risk for a      recurrent event.  He is currently on aspirin and Plavix.  We will      continue these medications and continue to evaluate his risk factor      profile and attempt to modify these much as possible.  2. Alcoholism:  The patient is on an Ativan detox.  He is also being      supplemented with intravenous thiamine and folic acid.  He will      need to a referral to alcohol and drug services on discharge.  3. Dyslipidemia:  The patient does have elevated triglycerides and was      commenced on Lopid therapy. 4.  Uncontrolled diabetes:  The      patient's hemoglobin A1c value indicates suboptimal outpatient      diabetic control.  He likely has issues with compliance to      medications.  Hemoglobin A1c was found to be 8.8%.  He was put on a      combination of Lantus and sliding scale insulin which has been      controlling his sugar in the hospital.  4. Hypertension:  Clonidine was added to the patient's      antihypertensive regimen yesterday.  Will need to continue to      monitor his blood pressure control and adjust his medications for      normotension.  5. Depression/anxiety:  The patient was continued on Paxil therapy.  6. Tobacco abuse:  The patient was counseled on the importance of      cessation.  He was provided with a nicotine patch while in the      hospital.   DISPOSITION:  The patient is medically stable and will be transferred to  the inpatient rehabilitation unit today.  Hillery Aldo, M.D.  Electronically Signed     CR/MEDQ  D:  12/21/2007  T:  12/21/2007  Job:  [id number]

## 2010-10-20 NOTE — H&P (Signed)
NAME:  Melvin Krueger, Melvin Krueger NO.:  [id number]   MEDICAL RECORD NO.:  [id number]          PATIENT TYPE:  INP   LOCATION:  3705                         FACILITY:  [facility]   PHYSICIAN:  Paula Compton, MD        DATE OF BIRTH:  Dec 05, 1950   DATE OF ADMISSION:  12/13/2008  DATE OF DISCHARGE:                              HISTORY & PHYSICAL   PRIMARY CARE Aryahna Spagna:  Dr.  Faustino Congress, [facility].   CHIEF COMPLAINT:  Altered mental status, hypotension.   HISTORY OF PRESENT ILLNESS:  A 60 year old male previously released from  [facility] in June 2010 transferred from [facility]  Emergency Department secondary to hypotension, altered mental status,  and abnormal CK concerning for early rhabdomyolysis.  Per chart review  in which all information was obtained because the patient was altered  and no family members available.  Family members were trying to give  involuntary commitment to the patient this afternoon as when they went  over to check on the patient, he stated that he saw little bodies  everywhere in this house and questionable that he was seeing dead  bodies.  He was also having auditory hallucinations, talking to people  that were not present.  The patient denies above.  Upon medical  clearance by emergency department, the patient was found to have blood  pressure of 89/51 and was given 1 liter of bolus with blood pressure  returning into the upper 90s systolic.  Per Behavior Health note, the  patient had similar mental status changes secondary to alcohol and  psychosis on November 24, 2008.  The patient currently denies any pain;  however, complete review of systems was unable to be obtained secondary  to the patient's altered mental status as he was falling asleep during  exam and had very slurred speech.   PAST MEDICAL HISTORY:  1. Substance abuse, alcohol, benzodiazepine.  2. Stroke left MCA in 2003.  3. Coronary artery disease.  4. Diabetes  mellitus.  5. Anxiety/depression.  6. 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   PAST SURGICAL HISTORY:  1. CABG in 1999.  2. Carotid endarterectomy.   ALLERGIES:  PENICILLIN.   Medications which were obtained per paper health worker:  1. Toprol-XL 50 mg p.o. q.a.m.  2. Altace 5 mg p.o. q.a.m.  3. Glucophage 1000 mg p.o. b.i.d.  4. Lopid 600 mg p.o. q.p.m.  5. Paxil 30 mg p.o. q.p.m.  6. Aspirin 81 mg p.o. daily.  7. Multivitamin p.o. daily.  8. Folic acid 1 mg p.o. daily.  9. Xanax 1 mg p.o. t.i.d.   FAMILY HISTORY:  Coronary artery disease.   SOCIAL HISTORY:  The patient lives alone.  Per report, is a heavy  drinker.  The patient admits to alcohol, however, does not state  quantity.  States smokes one and a half packs per day of tobacco.  Use  of illicit are unknown, as the patient did not complete sentences or  questions.   PHYSICAL EXAMINATION:  VITAL SIGNS:  Temperature 97.5, heart rate 76,  respiratory rate 18, blood  pressure 95/58, O2 sat 97% on 2 liters.  GENERAL:  The patient is disheveled, lethargic, falling asleep during  exam, mumbling and slurring speech, answering some questions.  HEENT:  PERRL, 4 mm bilaterally.  EOMI.  No nystagmus, nonicteric.  Dry  tongue.  Dry mucous membranes.  No lymphadenopathy.  Poor dentitions.  CVS:  Regular rate and rhythm.  No murmurs.  RESPIRATORY:  Scattered expiratory wheeze, upper airway  transmission/snoring.  Normal respiratory effort.  Equal breath sounds  bilaterally.  ABDOMEN:  Positive bowel sounds, soft, nontender, obese.  No apparent  distention.  EXTREMITIES:  No edema.  Pulses 2+.  NEURO:  GCS 15.  When the patient is awake, moving all extremities.  Complete neuro exam was unable to be obtained secondary to the patient's  altered mental status.   LABORATORY DATA:  CK 411, CK-MB 14.2, relative index 5.5.  Tox screen,  positive barbiturates, benzodiazepines.   Point-of-care myoglobin 350, troponin less  than 0.05, CK-MB 8.1.   Urinalysis, specific gravity 1.028, pH 5, urine glucose negative, small  bili, ketones 15, urobilinogen 1.0.  CMET, sodium 134, potassium 4.7,  chloride 101, CO2 of 21, BUN 20, creatinine 1.2, glucose 80, total bili  0.4, alk phos 84, AST 30, ALT 7, total protein 7, albumin 3.9, calcium  8.7.  White count 7.6, hemoglobin 14.3, hematocrit 43, platelets 223.  CBG 68 on admission.   CT of head without contrast, atrophy with small vessel chronic ischemia  changes of deep cerebral white matter, old infarcts, no acute  abnormalities.   Chest x-ray, no acute cardiopulmonary disease, mild DJD of thoracic  spine.   EKG, normal sinus rhythm, T-wave flattening in lateral leads.  No ST  elevations, Q-waves in leads II and III.   ASSESSMENT AND PLAN:  A 60 year old male with altered mental status,  hallucinations, and hypotension.  1. Altered mental status, unclear etiology although likely related to      substance abuse based on history.  UDS also positive for      barbiturates, benzodiazepines; however, benzodiazepines as per      records.  We will admit to telemetry, monitor overnight.  The      patient also had been lethargic so less likely falls on himself at      this time.  We placed on the CIWA protocol day #2 with history of      very heavy drinking in the past.  We will reassess mental status in      the a.m.  Follow up with son and daughter who attempted to have the      patient's involuntary commitment this afternoon.  2. Substance abuse.  CIWA protocol per above.  Once the patient is      medically stable, we will transfer to [facility] and consult      Psych.  We will give him nicotine patch.  3. Elevated CK.  CK not in range of rhabdomyolysis; however, the      patient dehydrated and may have had some injury with some muscle      breakdown at all and state of being altered.  We will cycle cardiac      enzymes, repeat EKG in a.m.  4. Diabetes  mellitus.  Unknown if the patient is taking medications.      We will obtain hemoglobin A1c.  We will use sliding scale insulin,      hold metformin.  Of note, the patient's creatinine is within normal  limits, however, slightly increased from baseline of 0.8 in June.  5. Hypertension, blood pressure borderline hypotensive.  At this      point, we will give IV fluid bolus as needed, hold      antihypertensives per chart records.  6. Coronary artery disease.  N.p.o., hold statin and aspirin.  7. Fluids, electrolytes, nutrition, gastrointestinal.  Electrolytes      within normal limits.  Banana bag not needed, n.p.o. D5 normal      saline at 100 mL an hour, CBGs q.4 h.  8. Prophylaxis.  CT negative for acute bleed.  Heparin b.i.d.  9. Code status.  Full code.  Unable to give consent, otherwise.   DISPOSITION:  Once stable, transfer to Behavior Health.      Milinda Antis, MD  Electronically Signed      Paula Compton, MD  Electronically Signed    KD/MEDQ  D:  12/13/2008  T:  12/13/2008  Job:  [id number]

## 2010-10-20 NOTE — Assessment & Plan Note (Signed)
Melvin Krueger returns to the clinic today accompanied by his daughter.  The  patient is a 60 year old male with history of coronary artery disease  and prior stroke in 2003 with minimal residual right hemiparesis.   The patient was admitted on December 18, 2007 with 4-day history of  worsening right-sided weakness and difficulty with his ambulation.  MRI  and MRA study of the brain showed an acute infarct in the left middle  cerebral artery affecting the left parietal, left frontal, and left  caudate nucleus along with right internal carotid artery occlusion.  There was also severe stenosis in the left internal carotid artery and  severe intracranial atherosclerosis.  A 2-D echocardiogram showed an  ejection fraction of 55-65% with moderate dilatation of the right atrium  with no thrombus.  Carotid Doppler showed severe calcified plaque in the  left internal carotid artery.  Plavix was added for stroke prophylaxis.  The patient was then eventually stabilized was moved to the  Rehabilitation Unit on December 21, 2007, and remained there through  discharge on January 09, 2008.   Since discharge, the patient has been living in his home with his  children living with them periodically.  He is attending outpatient  therapy.  He is walking with a walker, but sometimes has just started  using a quad cane during therapy.  He is independent with bathing and  dressing for the most part.  No speech therapy is ongoing at this time.  The patient reports that he has completely quit tobacco and alcohol.  He  still reports an occasional urge to smoke, but is not even using a patch  at this time.  He does see his primary care physician, Dr. Faustino Congress,  who actually is in the office, where the patient's daughter works.   MEDICATIONS:  1. Plavix 75 mg daily.  2. Toprol-XL 50 mg daily.  3. Altace 5 mg daily.  4. Glucophage 1000 mg q.p.m.  5. Lopid 600 mg q.p.m.  6. Paxil 30 mg daily.  7. Xanax 1 mg q.a.m.  8.  Coated aspirin 81 mg daily.  9. Folic acid 1 mg daily.  10.Tobrex eye drops 3 times a day p.r.n.   REVIEW OF SYSTEMS:  Noncontributory.   PHYSICAL EXAMINATION:  GENERAL:  Reasonably well-appearing middle-aged  adult male, seated in a regular chair, using a rolling walker for  ambulation.  VITAL SIGNS:  Blood pressure is 120/52 with a pulse of 82, respirations  18, and O2 saturation 97% on room air.  EXTREMITIES:  Left upper and left lower extremities strength were 5/5  throughout.  Bulk and tone were normal.  Reflexes were 2+ and  symmetrical.  Right lower extremity strength was 4+/5 in hip flexion and  extension and ankle dorsiflexion.  Right upper extremity strength was  4/5 proximally and 4-/5 distally.  He does have decreased quad hand  movements in his right hand compared to his left.   DIAGNOSES:  1. Status post left middle cerebral artery infarct with right-sided      weakness.  2. Hypertension.  3. History of alcohol abuse, off at present.  4. History of tobacco abuse, off at present.  5. Anxiety disorder.  6. History of depression.  7. Polycythemia.   In the office today, no adjustment of medication is necessary.  He will  be continuing with the therapies and should continue to make excellent  progress.  He is committed regarding the cessation of tobacco and  alcohol on his  own with encouragement from his family.  Hopefully, he  can start to get healthy when he is avoiding these advices.  We will  plan on seeing the patient in followup in approximately 3 months' time.           ______________________________  Ellwood Dense, M.D.     DC/MedQ  D:  03/15/2008 12:03:45  T:  03/16/2008 02:11:45  Job #:  [id number]

## 2010-10-20 NOTE — H&P (Signed)
NAME:  Melvin Krueger, Melvin Krueger NO.:  [id number]   MEDICAL RECORD NO.:  [id number]          PATIENT TYPE:  IPS   LOCATION:  0405                          FACILITY:  [facility]   PHYSICIAN:  Anselm Jungling, MD  DATE OF BIRTH:  Aug 10, 1950   DATE OF ADMISSION:  12/17/2008  DATE OF DISCHARGE:                       PSYCHIATRIC ADMISSION ASSESSMENT   This is a 60 year old male involuntarily petitioned on December 16, 2008.   HISTORY OF PRESENT ILLNESS:  The patient is currently on petition with  altered mental status, currently a transfer from a medical unit where  the patient was admitted for again altered mental status and  hypotension.  The patient apparently was seeing little bodies in his  home and experiencing auditory hallucinations.  Initially had slurred  speech and was falling asleep during the exam.  The patient does admit  that he takes Xanax to quickly and then runs out.  He denies any  suicidal thoughts.  Denies any current hallucinations.   PAST PSYCHIATRIC HISTORY:  First admission to [facility].  No current outpatient mental health treatment.   SOCIAL HISTORY:  This is a 60 year old male who lives with his son.  The  patient is on disability for his medical issues.   FAMILY HISTORY:  Unknown.   ALCOHOL AND DRUG HISTORY:  He denies any alcohol or substance use  although again he does admit to using his Xanax too quickly.   PRIMARY CARE [name]:  Dr. Faustino Congress.   MEDICAL PROBLEMS:  Type 2 diabetes, hypertension, and a CVA.   MEDICATIONS:  The patient was discharged on:   1. Toprol XL 50 mg daily.  2. Altace 5 mg daily.  3. Glucophage 1000 mg b.i.d.  4. Lopid 600 mg q.h.s.  5. Paxil 30 mg q.h.s.  6. Aspirin 81 mg daily.  7. Multivitamin daily.  8. Folic acid daily.  9. Xanax 0.5 mg t.i.d. that was a change in dosage.   DRUG ALLERGIES:  PENICILLIN.   PHYSICAL EXAMINATION:  This is a disheveled, middle-aged male currently  sitting in a  wheelchair with some right-sided weakness.  He appears in  no distress, however, and he offers no complaints.   LABORATORY DATA:  Showed a CAT scan of his head that showed atrophy and  small vessel changes.  Hemoglobin A1c of 5.110.  EKG was normal sinus  rhythm.  Urine culture was no growth.  TSH is 0.213.  Ammonia level was  elevated at 40 with a reference range 11-35.  His alcohol level was less  than 5.  Liver function was within normal limits.   MENTAL STATUS EXAM:  He is fully alert and cooperative, not psychotic.  No delusional statements made.  Feels well today.  Talks about going  home.  Thought processes again are coherent and goal directed.  Cognitive function intact.  His memory appears intact.  Judgment and  insight fair to good.   AXIS I:  Benzodiazepine dependence.  Mood disorder not otherwise  specified.  AXIS II:  Deferred.  AXIS III:  Type 2 diabetes, hypertension, and cerebrovascular accident.  AXIS IV:  Medical  problems.  AXIS V:  Current is 45-50.   Our plan is to have the patient contract for safety.  We will detox the  patient off the Xanax which was discussed with the patient.  The patient  is agreeable to such.  We will contact son for support and returning to  prior living arrangements and any other questions family has.  The  patient is considered a fall risk, and we will assist with ADLs as  needed.  His tentative length of stay is 1-2 days.      Landry Corporal, N.P.      Anselm Jungling, MD  Electronically Signed    JO/MEDQ  D:  12/17/2008  T:  12/17/2008  Job:  [id number]

## 2010-10-23 NOTE — Discharge Summary (Signed)
NAME:  Melvin Krueger, Melvin Krueger NO.:  [id number]   MEDICAL RECORD NO.:  [id number]          PATIENT TYPE:  IPS   LOCATION:  0405                          FACILITY:  [facility]   PHYSICIAN:  Anselm Jungling, MD  DATE OF BIRTH:  11-02-1950   DATE OF ADMISSION:  12/17/2008  DATE OF DISCHARGE:  12/23/2008                               DISCHARGE SUMMARY   IDENTIFYING DATA AND REASON FOR ADMISSION:  This was 1 of many inpatient  psychiatric admissions for Melvin Krueger, a 60 year old single white male,  admitted due to symptoms of psychosis initially, that were subsequently  determined to be solely due to his increasing history of severe alcohol  abuse and dependence.  He had also been abusing Xanax, contributing to  the development of his presenting picture, which included symptoms of  confusion and mild delirium.  He had been referred by his family.  Please refer to the admission note for further details pertaining to the  symptoms, circumstances and history that led to his hospitalization.  He  was given initial Axis I diagnoses of alcohol dependence, benzodiazepine  dependence, and mood disorder NOS, rule substance-induced mood disorder,  rule out substance-induced confusional state.   MEDICAL AND LABORATORY:  The patient was medically and physically  assessed by the psychiatric nurse practitioner.  He came to [place] with a  history of hypertension, non-insulin dependent diabetes mellitus,  hyperlipidemia.  He was continued on his usual Toprol XL, Altace,  Glucophage, Lopid, aspirin, and there were no significant medical issues  during his inpatient stay.   HOSPITAL COURSE:  The patient was admitted to the adult inpatient  psychiatric service.  He presented as a well-nourished, normally-  developed adult male who was moderately disheveled, and nonpsychotic.  In the initial interview, he was no longer displaying any confusion or  delirium.  He denied any auditory or visual hallucinations  and did not  appear to be delusional.  He indicated that he fell well in contrast to  his parents, which was that of an individual who was tired, disheveled,  and moderately tremulous.  He denied alcohol use.  He did admit that he  had been taking his prescription Xanax too quickly and then running out.   We later learned from his family, that he had been abusing alcohol to a  substantial degree.  His son described many episodes of finding him  passed out, sometimes in his own excrement.  When the patient was  confronted with this information, both in a family session, and in other  situations during his treatment stay, he became angry, defensive, and  was in denial.  He never fully acknowledged the degree of his alcohol  dependence.  One of his stock responses was to accuse his son of having  a worse drinking problem, which did not appear to be the case.   The patient was detoxified utilizing a Librium withdrawal protocol.  His  detoxification presented uneventfully.   In family conferencing, his adult son was quite clear in indicating that  he did not feel that he could put up with his father's  episodes of  drunkenness and passing out much longer.  Despite the directness of his  son's commentary, the patient continued to maintain at other times that  his son was quite willing to have him back, and that there was no  difficulty there.   The patient appeared to obtain maximum benefit from his stay on the 8th  hospital day.  The patient's family was contacted.  The patient and the  family agreed to the following aftercare plan.   AFTERCARE:  The patient was to follow-up at [facility] with an appointment to their psychiatrist on January 28, 2009 at 2  p.m.   DISCHARGE MEDICATIONS:  Toprol XL 50 mg daily, Altace 5 mg daily,  Glucophage 1000 mg b.i.d., Lopid 600 mg q.h.s., Paxil 30 mg q.h.s. (the  patient had come to [place] on this medication), aspirin 81 mg daily,   multivitamin 1 tablet daily, folic acid 1 mg daily, and the patient was  instructed to stop using, permanently, Xanax.   DISCHARGE DIAGNOSES:  AXIS I: Alcohol dependence, benzodiazepine  dependence, early remission.  Substance-induced mood disorder.  AXIS II: Deferred.  AXIS III: History of hypertension, hyperlipidemia, non-insulin-dependent  diabetes mellitus.  AXIS IV: Stressors severe.  AXIS V: GAF on discharge 50.      Anselm Jungling, MD  Electronically Signed     SPB/MEDQ  D:  12/26/2008  T:  12/26/2008  Job:  [id number]

## 2010-10-23 NOTE — Consult Note (Signed)
NAME:  Melvin Krueger, Melvin Krueger                        ACCOUNT NO.:  [id number]   MEDICAL RECORD NO.:  [id number]                   PATIENT TYPE:  INP   LOCATION:  4743                                 FACILITY:  [facility]   PHYSICIAN:  Quita Skye. Hart Rochester, M.D.               DATE OF BIRTH:  09-20-1950   DATE OF CONSULTATION:  DATE OF DISCHARGE:                      THORACIC/VASCULAR SURGERY CONSULTATION   CHIEF COMPLAINT:  Right-sided weakness secondary to left brain cerebral  vascular accident.   HISTORY OF PRESENT ILLNESS:  A 60 year old, white male patient who began  having symptoms 5 days ago consisting of some intermittent right-sided  weakness and numbness.  Over the last 2 to 3 days, this became more  significant involving his right upper and lower extremity and also had  visual blurring in the right eye and difficulty with word recall.  He was  admitted on January 23, 2002, and CT scan revealed a left posterior parietal  CVA.  The patient has no history of antecedent TIAs, amaurosis fugax, CVA,  diplopia, blurred vision.   PAST MEDICAL HISTORY:  Positive for coronary artery disease, having coronary  artery bypass grafting 1999 by Dr. Kathlee Nations Trigt.  He also has  hypertension, depression, but denies diabetes mellitus, chronic obstructive  pulmonary disease, deep venous thrombosis, thrombus phlebitis.  He also  denies pulmonary emboli.   PREVIOUS SURGERIES:  Includes coronary artery bypass grafting.   MEDICINES:  Paxil, Xanax, Toprol, and Aspirin.   ALLERGIES:  PENICILLIN.   SOCIAL HISTORY:  He lives with his wife.  He smokes one pack of cigarettes  per day since he was a teenager and drinks 6 to 12 beers per day.   FAMILY HISTORY:  Positive for coronary artery disease (his father).   REVIEW OF SYSTEMS:  Otherwise unremarkable.  Denies lower extremity  claudication.   PHYSICAL EXAMINATION:  VITAL SIGNS:  Blood pressure 114/75, heart rate 60,  respirations 24, temperature 98.  GENERAL:  He is a middle-aged male patient in no apparent distress.  He is  alert and oriented times 3.  NECK:  Supple with 3+ carotid pulses.  NEUROLOGICAL:  Reveals some mild clumsiness in the right upper an lower  extremities and some decreased visual acuity on the right side grossly.  No  palpable adenopathy in the neck.  Upper extremity pulses are 3+.  CHEST:  Clear to auscultation.  CARDIOVASCULAR:  Reveals a regular rhythm with no murmurs.  ABDOMEN:  Soft, nontender with no masses.  He has 3+ femoral and 2+  popliteal and 2+ dorsalis pedis pulses bilaterally with no distal edema.   Carotid Duplex exam on January 23, 2002, revealed occlusion of the right  internal carotid artery and 90% to 95% left internal carotid stenosis.   IMPRESSION:  1. 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  2. Coronary artery disease status-post coronary artery bypass grafting in  1999.   RECOMMENDATIONS:  The patient needs a left carotid endarterectomy for  prevention of further cerebral vascular accident.  We would like to wait a  few more days before proceeding.  We are tentatively look at first of next  week, possibly Monday January 29, 2002, for a left carotid endarterectomy.                                                 Quita Skye Hart Rochester, M.D.    JDL/MEDQ  D:  01/24/2002  T:  01/26/2002  Job:  [id number]   cc:   Ward Givens, M.D.

## 2011-01-07 ENCOUNTER — Emergency Department (HOSPITAL_COMMUNITY): Payer: Medicare Other

## 2011-01-07 ENCOUNTER — Emergency Department (HOSPITAL_COMMUNITY)
Admission: EM | Admit: 2011-01-07 | Discharge: 2011-01-07 | Disposition: A | Discharge status: Home / Self Care | Payer: Medicare Other | Attending: Emergency Medicine | Admitting: Emergency Medicine

## 2011-01-07 DIAGNOSIS — I10 Essential (primary) hypertension: Secondary | ICD-10-CM | POA: Insufficient documentation

## 2011-01-07 DIAGNOSIS — F341 Dysthymic disorder: Secondary | ICD-10-CM | POA: Insufficient documentation

## 2011-01-07 DIAGNOSIS — R42 Dizziness and giddiness: Secondary | ICD-10-CM | POA: Insufficient documentation

## 2011-01-07 DIAGNOSIS — E119 Type 2 diabetes mellitus without complications: Secondary | ICD-10-CM | POA: Insufficient documentation

## 2011-01-07 DIAGNOSIS — Z951 Presence of aortocoronary bypass graft: Secondary | ICD-10-CM | POA: Insufficient documentation

## 2011-01-07 DIAGNOSIS — Z79899 Other long term (current) drug therapy: Secondary | ICD-10-CM | POA: Insufficient documentation

## 2011-01-07 DIAGNOSIS — Z7982 Long term (current) use of aspirin: Secondary | ICD-10-CM | POA: Insufficient documentation

## 2011-01-07 DIAGNOSIS — Z8673 Personal history of transient ischemic attack (TIA), and cerebral infarction without residual deficits: Secondary | ICD-10-CM | POA: Insufficient documentation

## 2011-01-07 LAB — DIFFERENTIAL
Basophils Absolute: 0 10*3/uL (ref 0.0–0.1)
Eosinophils Absolute: 0.1 10*3/uL (ref 0.0–0.7)
Eosinophils Relative: 2 % (ref 0–5)
Lymphocytes Relative: 30 % (ref 12–46)
Neutrophils Relative %: 58 % (ref 43–77)

## 2011-01-07 LAB — BASIC METABOLIC PANEL
BUN: 11 mg/dL (ref 6–23)
CO2: 27 mEq/L (ref 19–32)
Chloride: 102 mEq/L (ref 96–112)
Creatinine, Ser: 0.74 mg/dL (ref 0.50–1.35)
Glucose, Bld: 82 mg/dL (ref 70–99)
Potassium: 4.1 mEq/L (ref 3.5–5.1)

## 2011-01-07 LAB — CBC
Platelets: 161 10*3/uL (ref 150–400)
RBC: 5.12 MIL/uL (ref 4.22–5.81)
RDW: 12.4 % (ref 11.5–15.5)
WBC: 6.1 10*3/uL (ref 4.0–10.5)

## 2011-01-07 LAB — URINALYSIS, ROUTINE W REFLEX MICROSCOPIC
Bilirubin Urine: NEGATIVE
Glucose, UA: NEGATIVE mg/dL
Ketones, ur: NEGATIVE mg/dL
Nitrite: NEGATIVE
Specific Gravity, Urine: 1.011 (ref 1.005–1.030)
pH: 5 (ref 5.0–8.0)

## 2011-01-07 LAB — ETHANOL: Alcohol, Ethyl (B): <11 mg/dL (ref 0–11)

## 2011-01-14 ENCOUNTER — Emergency Department (HOSPITAL_COMMUNITY)
Admission: EM | Admit: 2011-01-14 | Discharge: 2011-01-16 | Disposition: A | Discharge status: Home / Self Care | Payer: Medicare Other | Attending: Emergency Medicine | Admitting: Emergency Medicine

## 2011-01-14 DIAGNOSIS — I10 Essential (primary) hypertension: Secondary | ICD-10-CM | POA: Insufficient documentation

## 2011-01-14 DIAGNOSIS — Z8673 Personal history of transient ischemic attack (TIA), and cerebral infarction without residual deficits: Secondary | ICD-10-CM | POA: Insufficient documentation

## 2011-01-14 DIAGNOSIS — E119 Type 2 diabetes mellitus without complications: Secondary | ICD-10-CM | POA: Insufficient documentation

## 2011-01-14 DIAGNOSIS — Z046 Encounter for general psychiatric examination, requested by authority: Secondary | ICD-10-CM | POA: Insufficient documentation

## 2011-01-14 DIAGNOSIS — F1320 Sedative, hypnotic or anxiolytic dependence, uncomplicated: Secondary | ICD-10-CM | POA: Insufficient documentation

## 2011-01-14 DIAGNOSIS — F29 Unspecified psychosis not due to a substance or known physiological condition: Secondary | ICD-10-CM | POA: Insufficient documentation

## 2011-01-14 DIAGNOSIS — F19939 Other psychoactive substance use, unspecified with withdrawal, unspecified: Secondary | ICD-10-CM | POA: Insufficient documentation

## 2011-01-14 LAB — DIFFERENTIAL
Basophils Relative: 0 % (ref 0–1)
Eosinophils Absolute: 0.2 10*3/uL (ref 0.0–0.7)
Lymphs Abs: 2.5 10*3/uL (ref 0.7–4.0)
Monocytes Relative: 8 % (ref 3–12)
Neutro Abs: 5.5 10*3/uL (ref 1.7–7.7)
Neutrophils Relative %: 62 % (ref 43–77)

## 2011-01-14 LAB — BASIC METABOLIC PANEL
BUN: 7 mg/dL (ref 6–23)
CO2: 23 mEq/L (ref 19–32)
Calcium: 9.3 mg/dL (ref 8.4–10.5)
Creatinine, Ser: 0.64 mg/dL (ref 0.50–1.35)
GFR calc non Af Amer: >60 mL/min (ref >60)
Glucose, Bld: 84 mg/dL (ref 70–99)

## 2011-01-14 LAB — URINALYSIS, ROUTINE W REFLEX MICROSCOPIC
Glucose, UA: NEGATIVE mg/dL
Hgb urine dipstick: NEGATIVE
Leukocytes, UA: NEGATIVE
Protein, ur: NEGATIVE mg/dL
Specific Gravity, Urine: 1.008 (ref 1.005–1.030)
Urobilinogen, UA: 0.2 mg/dL (ref 0.0–1.0)

## 2011-01-14 LAB — CBC
Hemoglobin: 18.4 g/dL — ABNORMAL HIGH (ref 13.0–17.0)
Platelets: 194 10*3/uL (ref 150–400)
RBC: 5.44 MIL/uL (ref 4.22–5.81)
WBC: 8.9 10*3/uL (ref 4.0–10.5)

## 2011-01-14 LAB — RAPID URINE DRUG SCREEN, HOSP PERFORMED
Amphetamines: NOT DETECTED
Benzodiazepines: NOT DETECTED
Opiates: NOT DETECTED
Tetrahydrocannabinol: NOT DETECTED

## 2011-03-04 LAB — COMPREHENSIVE METABOLIC PANEL
BUN: 5 — ABNORMAL LOW
Calcium: 8.7
Glucose, Bld: 176 — ABNORMAL HIGH
Sodium: 133 — ABNORMAL LOW
Total Protein: 5.8 — ABNORMAL LOW

## 2011-03-04 LAB — BLOOD GAS, ARTERIAL
Acid-Base Excess: 2.8 — ABNORMAL HIGH
O2 Saturation: 63.2
TCO2: 24.7
pCO2 arterial: 51.9 — ABNORMAL HIGH

## 2011-03-04 LAB — RAPID URINE DRUG SCREEN, HOSP PERFORMED
Amphetamines: NOT DETECTED
Opiates: NOT DETECTED
Tetrahydrocannabinol: NOT DETECTED

## 2011-03-04 LAB — POCT I-STAT, CHEM 8
Calcium, Ion: 1.1 — ABNORMAL LOW
Creatinine, Ser: 0.8
Glucose, Bld: 292 — ABNORMAL HIGH
HCT: 58 — ABNORMAL HIGH
Hemoglobin: 19.7 — ABNORMAL HIGH
TCO2: 30

## 2011-03-04 LAB — CBC
HCT: 52.7 — ABNORMAL HIGH
Hemoglobin: 18.2 — ABNORMAL HIGH
MCHC: 34.6
Platelets: 195
RDW: 12.7

## 2011-03-04 LAB — LIPID PANEL
HDL: 21 — ABNORMAL LOW
Total CHOL/HDL Ratio: 9.2
VLDL: UNDETERMINED

## 2011-03-04 LAB — CARDIAC PANEL(CRET KIN+CKTOT+MB+TROPI)
Total CK: 38
Troponin I: 0.01

## 2011-03-04 LAB — PROTIME-INR
INR: 1
Prothrombin Time: 13.1

## 2011-03-04 LAB — HEMOGLOBIN A1C
Hgb A1c MFr Bld: 8.8 — ABNORMAL HIGH
Mean Plasma Glucose: 236

## 2011-03-05 LAB — COMPREHENSIVE METABOLIC PANEL
ALT: 18
ALT: 22
AST: 23
Albumin: 2.9 — ABNORMAL LOW
Albumin: 3.3 — ABNORMAL LOW
Albumin: 4.2
Alkaline Phosphatase: 85
Alkaline Phosphatase: 85
BUN: 5 — ABNORMAL LOW
BUN: 5 — ABNORMAL LOW
Chloride: 100
Creatinine, Ser: 0.59
GFR calc Af Amer: >60
Potassium: 4
Potassium: 3.7
Potassium: 4.2
Sodium: 134 — ABNORMAL LOW
Total Bilirubin: 1.2
Total Protein: 5.4 — ABNORMAL LOW
Total Protein: 7.3

## 2011-03-05 LAB — CBC
HCT: 49.4
HCT: 49.6
HCT: 50.9
Hemoglobin: 17.8 — ABNORMAL HIGH
Hemoglobin: 17.3 — ABNORMAL HIGH
MCHC: 34.8
MCV: 95.1
MCV: 95.6
Platelets: 157
Platelets: 161
Platelets: 173
Platelets: 251
RDW: 12.8
RDW: 12.3
WBC: 6.8
WBC: 7.1

## 2011-03-05 LAB — GLUCOSE, CAPILLARY: Glucose-Capillary: 150 — ABNORMAL HIGH

## 2011-03-05 LAB — DIFFERENTIAL
Basophils Absolute: 0
Basophils Relative: 0
Eosinophils Absolute: 0.1
Eosinophils Relative: 2
Monocytes Absolute: 0.7
Neutro Abs: 4.5

## 2011-03-05 LAB — BASIC METABOLIC PANEL
Calcium: 8.6
GFR calc Af Amer: >60
GFR calc non Af Amer: >60
Sodium: 137

## 2011-03-05 LAB — URINALYSIS, ROUTINE W REFLEX MICROSCOPIC
Ketones, ur: 15 — AB
Nitrite: NEGATIVE
Protein, ur: NEGATIVE

## 2011-03-09 LAB — HEMOGLOBIN A1C: Mean Plasma Glucose: 143 mg/dL

## 2011-03-09 LAB — COMPREHENSIVE METABOLIC PANEL
ALT: 13
Albumin: 3.7
Alkaline Phosphatase: 63
BUN: 6
Chloride: 100
Glucose, Bld: 81
Potassium: 3.1 — ABNORMAL LOW
Sodium: 137
Total Bilirubin: 1
Total Protein: 6.2

## 2011-03-09 LAB — GLUCOSE, CAPILLARY
Glucose-Capillary: 104 mg/dL — ABNORMAL HIGH (ref 70–99)
Glucose-Capillary: 129 mg/dL — ABNORMAL HIGH (ref 70–99)
Glucose-Capillary: 135 mg/dL — ABNORMAL HIGH (ref 70–99)
Glucose-Capillary: 145 mg/dL — ABNORMAL HIGH (ref 70–99)
Glucose-Capillary: 148 mg/dL — ABNORMAL HIGH (ref 70–99)
Glucose-Capillary: 87 mg/dL (ref 70–99)
Glucose-Capillary: 77

## 2011-03-09 LAB — RAPID URINE DRUG SCREEN, HOSP PERFORMED
Cocaine: NOT DETECTED
Tetrahydrocannabinol: NOT DETECTED

## 2011-03-09 LAB — DIFFERENTIAL
Basophils Absolute: 0
Basophils Relative: 0
Eosinophils Absolute: 0.1
Monocytes Absolute: 0.7
Monocytes Relative: 10
Neutro Abs: 4.8
Neutrophils Relative %: 63

## 2011-03-09 LAB — CBC
HCT: 49.4
Hemoglobin: 16.6
WBC: 7.6

## 2011-03-09 LAB — ETHANOL: Alcohol, Ethyl (B): <5

## 2011-03-10 LAB — TSH: TSH: 0.505

## 2011-03-10 LAB — POCT I-STAT, CHEM 8
BUN: 10
BUN: 5 — ABNORMAL LOW
Calcium, Ion: 0.9 — ABNORMAL LOW
Chloride: 96
Chloride: 99
Creatinine, Ser: 0.7
Creatinine, Ser: 0.8
Glucose, Bld: 123 — ABNORMAL HIGH
HCT: 54 — ABNORMAL HIGH
Hemoglobin: 18.4 — ABNORMAL HIGH
Potassium: 3.5
Potassium: 3.7
Sodium: 128 — ABNORMAL LOW
Sodium: 131 — ABNORMAL LOW
TCO2: 24
TCO2: 24

## 2011-03-10 LAB — T4, FREE: Free T4: 1.25

## 2011-03-12 LAB — GLUCOSE, CAPILLARY
Glucose-Capillary: 109 mg/dL — ABNORMAL HIGH (ref 70–99)
Glucose-Capillary: 137 mg/dL — ABNORMAL HIGH (ref 70–99)
Glucose-Capillary: 182 mg/dL — ABNORMAL HIGH (ref 70–99)

## 2011-04-27 ENCOUNTER — Emergency Department (HOSPITAL_COMMUNITY)
Admission: EM | Admit: 2011-04-27 | Discharge: 2011-04-27 | Disposition: A | Discharge status: Home / Self Care | Payer: Medicare Other | Attending: Emergency Medicine | Admitting: Emergency Medicine

## 2011-04-27 ENCOUNTER — Emergency Department (HOSPITAL_COMMUNITY): Payer: Medicare Other

## 2011-04-27 DIAGNOSIS — F17200 Nicotine dependence, unspecified, uncomplicated: Secondary | ICD-10-CM | POA: Insufficient documentation

## 2011-04-27 DIAGNOSIS — M169 Osteoarthritis of hip, unspecified: Secondary | ICD-10-CM | POA: Insufficient documentation

## 2011-04-27 DIAGNOSIS — M1610 Unilateral primary osteoarthritis, unspecified hip: Secondary | ICD-10-CM | POA: Insufficient documentation

## 2011-04-27 DIAGNOSIS — R64 Cachexia: Secondary | ICD-10-CM | POA: Insufficient documentation

## 2011-04-27 DIAGNOSIS — M25559 Pain in unspecified hip: Secondary | ICD-10-CM | POA: Insufficient documentation

## 2011-04-27 DIAGNOSIS — M1990 Unspecified osteoarthritis, unspecified site: Secondary | ICD-10-CM

## 2011-04-27 DIAGNOSIS — I2510 Atherosclerotic heart disease of native coronary artery without angina pectoris: Secondary | ICD-10-CM | POA: Insufficient documentation

## 2011-04-27 DIAGNOSIS — I6990 Unspecified sequelae of unspecified cerebrovascular disease: Secondary | ICD-10-CM | POA: Insufficient documentation

## 2011-04-27 DIAGNOSIS — R5381 Other malaise: Secondary | ICD-10-CM | POA: Insufficient documentation

## 2011-04-27 DIAGNOSIS — I10 Essential (primary) hypertension: Secondary | ICD-10-CM | POA: Insufficient documentation

## 2011-04-27 DIAGNOSIS — R5383 Other fatigue: Secondary | ICD-10-CM | POA: Insufficient documentation

## 2011-04-27 HISTORY — DX: Atherosclerotic heart disease of native coronary artery without angina pectoris: I25.10

## 2011-04-27 HISTORY — DX: Essential (primary) hypertension: I10

## 2011-04-27 MED ORDER — OXYCODONE-ACETAMINOPHEN 5-325 MG PO TABS: 1.0000 | ORAL_TABLET | ORAL | Status: AC | PRN

## 2011-04-27 MED ORDER — IBUPROFEN 200 MG PO TABS
600.0000 mg | ORAL_TABLET | Freq: Once | ORAL | Status: AC
  Administered 2011-04-27: 600 mg via ORAL
  Filled 2011-04-27: qty 3

## 2011-04-27 NOTE — ED Notes (Signed)
Pt's son at bedside,he reports that pt has been falling recently.  Last fall was Sunday after last.  He also reports that pt has been taking his xanax more than prescribed.  He also reports that pt has been "delirious and sun downing".  He reports that pt was seen at [facility] last month for this.  He is requesting to have pt evaluated for disorientation.  He reports that pt told him that he's 60 yo and

## 2011-04-27 NOTE — ED Notes (Signed)
Pt is A&Ox 4, reports taking his xanax more than prescribed d/t pain. Pt states "it helps with the pain, but im not taking a lot."

## 2011-04-27 NOTE — ED Provider Notes (Signed)
History     CSN: [id number] Arrival date & time: 04/27/2011  3:56 PM   None     Chief Complaint  Patient presents with  . Hip Pain    (Consider location/radiation/quality/duration/timing/severity/associated sxs/prior treatment) Patient is a 60 y.o. male presenting with hip pain. The history is provided by the patient and a relative. No language interpreter was used.  Hip Pain This is a recurrent problem. The current episode started 1 to 4 weeks ago. The problem occurs constantly. The problem has been unchanged. Associated symptoms include fatigue and myalgias. Pertinent negatives include no abdominal pain, chest pain, fever, joint swelling, nausea, neck pain or vomiting.   Presents to the ER after a fall 1 week ago with c/o R hip pain.  PMH of stroke effecting his R Side.  Uses a walker and an electric chair.  Was transferring out of the chair when he fell. No deformity noted, good pedal pulse. And sensation.. Past Medical History  Diagnosis Date  . Coronary artery disease   . Hypertension   . Diabetes mellitus     Past Surgical History  Procedure Date  . Cardiac surgery     No family history on file.  History  Substance Use Topics  . Smoking status: Current Everyday Smoker  . Smokeless tobacco: Not on file  . Alcohol Use:       Review of Systems  Constitutional: Positive for fatigue. Negative for fever.  HENT: Negative for neck pain.   Cardiovascular: Negative for chest pain.  Gastrointestinal: Negative for nausea, vomiting and abdominal pain.  Musculoskeletal: Positive for myalgias. Negative for joint swelling.  All other systems reviewed and are negative.    Allergies  Penicillins  Home Medications  No current outpatient prescriptions on file.  BP 124/58  Pulse 68  Temp(Src) 99.9 F (37.7 C) (Oral)  Resp 18  SpO2 95%  Physical Exam  Constitutional: He appears cachectic. He is cooperative. He is easily aroused. He appears toxic. He does not have a  sickly appearance. He appears ill. No distress.  Neurological: He is alert and easily aroused.    ED Course  Procedures (including critical care time)  Labs Reviewed - No data to display Dg Hip Bilateral W/pelvis  04/27/2011  *RADIOLOGY REPORT*  Clinical Data: Fall.  Hip pain.  BILATERAL HIP WITH PELVIS - 4+ VIEW  Comparison: None  Findings: There is mild to moderate osteoarthritis involving the right hip is noted.  Changes include subchondral sclerosis and marginal spur formation.  No right hip fracture or dislocation.  There is mild to moderate osteoarthritis involving the left hip as well.  This includes marginal spur formation and subchondral sclerosis.  No fractures or subluxations.  IMPRESSION:  1.  No acute findings. 2.  Mild to moderate bilateral hip osteoarthritis.  Original Report Authenticated By: Rosealee Albee, M.D.     No diagnosis found.    MDM  Fall out of his electric chair 1 week ago with R hip pain.  Has been taking bc powder without relief.  Hip film shows no fracture.  Will follow up with PCP on Nov 26 or return if worse.  Percocet for pain.  Ibuprofen and tylenol in the ER with some relief.  Son with pt concerned he has taken too much of his xanax.  He has taken 12 pills too many in 20 days.  Pt is oriented presently.  Son refused BH at this time as well as patient.  Jethro Bastos, NP 04/28/11 Marlyne Beards

## 2011-04-27 NOTE — ED Notes (Signed)
Anne EDNP notified re pt's son's concerns.

## 2011-04-27 NOTE — ED Notes (Signed)
Pt reports fall while attempting to transfer to his wheelchair x 10 days ago.  Pt reports R hip pain, pain is worse when palpating his R hip.  Pt is A&O x 4.  Denies any other complaints at this time.

## 2011-04-27 NOTE — ED Notes (Signed)
Pt c/o rt hip pain, fall 10 days ago, denies injury, no deformity or rotation noted

## 2011-04-27 NOTE — ED Notes (Signed)
Pt given discharge instructions and verbalizes understanding  

## 2011-04-28 NOTE — ED Provider Notes (Signed)
Medical screening examination/treatment/procedure(s) were performed by non-physician practitioner and as supervising physician I was immediately available for consultation/collaboration.  Raeford Razor, MD 04/28/11 [phone]

## 2011-04-30 ENCOUNTER — Encounter (HOSPITAL_COMMUNITY): Payer: Self-pay | Admitting: Adult Health

## 2011-04-30 ENCOUNTER — Emergency Department (HOSPITAL_COMMUNITY)
Admission: EM | Admit: 2011-04-30 | Discharge: 2011-05-01 | Disposition: A | Discharge status: Home / Self Care | Payer: Medicare Other | Attending: Emergency Medicine | Admitting: Emergency Medicine

## 2011-04-30 DIAGNOSIS — Z79899 Other long term (current) drug therapy: Secondary | ICD-10-CM | POA: Insufficient documentation

## 2011-04-30 DIAGNOSIS — F17200 Nicotine dependence, unspecified, uncomplicated: Secondary | ICD-10-CM | POA: Insufficient documentation

## 2011-04-30 DIAGNOSIS — Z7982 Long term (current) use of aspirin: Secondary | ICD-10-CM | POA: Insufficient documentation

## 2011-04-30 DIAGNOSIS — F1010 Alcohol abuse, uncomplicated: Secondary | ICD-10-CM

## 2011-04-30 DIAGNOSIS — E119 Type 2 diabetes mellitus without complications: Secondary | ICD-10-CM | POA: Insufficient documentation

## 2011-04-30 DIAGNOSIS — I2510 Atherosclerotic heart disease of native coronary artery without angina pectoris: Secondary | ICD-10-CM | POA: Insufficient documentation

## 2011-04-30 DIAGNOSIS — I10 Essential (primary) hypertension: Secondary | ICD-10-CM | POA: Insufficient documentation

## 2011-04-30 DIAGNOSIS — I69959 Hemiplegia and hemiparesis following unspecified cerebrovascular disease affecting unspecified side: Secondary | ICD-10-CM | POA: Insufficient documentation

## 2011-04-30 HISTORY — DX: Cerebral infarction, unspecified: I63.9

## 2011-04-30 LAB — RAPID URINE DRUG SCREEN, HOSP PERFORMED
Amphetamines: NOT DETECTED
Barbiturates: NOT DETECTED
Benzodiazepines: POSITIVE — AB

## 2011-04-30 LAB — COMPREHENSIVE METABOLIC PANEL
ALT: 11 U/L (ref 0–53)
BUN: 17 mg/dL (ref 6–23)
CO2: 26 mEq/L (ref 19–32)
Calcium: 9.7 mg/dL (ref 8.4–10.5)
GFR calc Af Amer: >90 mL/min (ref >90)
GFR calc non Af Amer: >90 mL/min (ref >90)
Glucose, Bld: 93 mg/dL (ref 70–99)
Sodium: 138 mEq/L (ref 135–145)
Total Protein: 7.3 g/dL (ref 6.0–8.3)

## 2011-04-30 LAB — CBC
Hemoglobin: 17.6 g/dL — ABNORMAL HIGH (ref 13.0–17.0)
MCHC: 35.3 g/dL (ref 30.0–36.0)
RDW: 12.9 % (ref 11.5–15.5)
WBC: 7.7 10*3/uL (ref 4.0–10.5)

## 2011-04-30 LAB — ACETAMINOPHEN LEVEL: Acetaminophen (Tylenol), Serum: <15 ug/mL (ref 10–30)

## 2011-04-30 LAB — ETHANOL: Alcohol, Ethyl (B): <11 mg/dL (ref 0–11)

## 2011-04-30 NOTE — ED Notes (Signed)
Per police: pt coming from [place] (IVC).  Sent here because [facility] can't take pts who are in a wheelchair.  Needs other placement.

## 2011-04-30 NOTE — ED Notes (Signed)
Family took out IVC papers due to pt being combative. Pt denies SI and HI. States his family drives him crazy.

## 2011-05-01 ENCOUNTER — Encounter (HOSPITAL_COMMUNITY): Payer: Self-pay
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
## 2011-05-01 NOTE — ED Notes (Signed)
Pt. Has belongings.

## 2011-05-01 NOTE — ED Notes (Signed)
CSW met with pt by bedside to discuss concerns verbalized by pt in regards to his son living in his home. CSW contacted [employer] who provided pt with the appropriate channel of procedures needed to address his concern. Pt informed writer that he currently has no means of transportation to get back to his residence. Pt stated that his son has no driver license or car and that he has no number to reach him. CSW telephoned the numbers listed for pt's daughter and was unable to contact her. RN notified CSW pt may be able to transport via [facility] but was later notified that [facility] service was unavailable due to pt's wheelchair. NT telephoned pt's daughter at her work number and requested for pt's daughter to call [facility]. Pt's daughter telephoned CSW and reported that she would be able to transport in 45 mins. RN notified. No other concerns verbalized by pt at this time.

## 2011-05-01 NOTE — ED Provider Notes (Signed)
Medical screening examination/treatment/procedure(s) were conducted as a shared visit with non-physician practitioner(s) and myself.  I personally evaluated the patient during the encounter  Olivia Mackie, MD 05/01/11 [phone]

## 2011-05-01 NOTE — ED Notes (Signed)
Pt seen by the EDP,  tele psych conference is done, pt at this time is comfortably resting, eyes closed, even, regular and symmetrical breathing, no apparent distress noted, sitter at the bedside.

## 2011-05-01 NOTE — ED Provider Notes (Signed)
Medical screening examination/treatment/procedure(s) were conducted as a shared visit with non-physician practitioner(s) and myself.  I personally evaluated the patient during the encounter. 60 year old male who had IVC paperwork taken out by his daughter with report of taking medications inappropriately, being aggressive and harm to self and others. Patient denies any of these complaints. Family not available to talk with directly, but PA Dammen did speak to the daughter on the phone. Patient seen by telepsych who also feels that he is not a danger to self or others and IVC papers have been rescinded. Patient needs social work evaluation in the morning  Olivia Mackie, MD 05/01/11 [phone]

## 2011-05-01 NOTE — ED Notes (Signed)
Pt. brought to room. ACT team in talking with pt. At this time.

## 2011-05-01 NOTE — ED Notes (Signed)
Report called to [employer] in [place], pt to go to room 29

## 2011-05-01 NOTE — ED Notes (Signed)
[facility] here to transport the pt. home

## 2011-05-01 NOTE — ED Notes (Signed)
Per Child psychotherapist on staff, pt.  Not going by [facility], family will pick him up at ED shortly.

## 2011-05-01 NOTE — ED Provider Notes (Signed)
Patient is resting comfortably in the emergency department waiting on social work consult at 7 AM to discuss outpatient resources for alcohol abuse, and available resources for assisted living if desired, and family counseling. Sign out given by Henrene Dodge, physician assistant. Patient denies suicidal ideation or homicidal ideation. Patient has had a telemetry psych consult where they have noted him safe to return home without need for inpatient psychiatry admission or involuntary commitment.  Melvin Krueger, [place] 05/01/11 [phone]

## 2011-05-01 NOTE — ED Notes (Signed)
Per ACT team, pt. To be discharged.

## 2011-05-29 ENCOUNTER — Encounter (HOSPITAL_COMMUNITY): Payer: Self-pay

## 2011-05-29 ENCOUNTER — Emergency Department (HOSPITAL_COMMUNITY): Payer: Medicare Other

## 2011-05-29 ENCOUNTER — Other Ambulatory Visit: Payer: Self-pay

## 2011-05-29 ENCOUNTER — Emergency Department (HOSPITAL_COMMUNITY)
Admission: EM | Admit: 2011-05-29 | Discharge: 2011-05-29 | Disposition: A | Discharge status: Home / Self Care | Payer: Medicare Other | Attending: Emergency Medicine | Admitting: Emergency Medicine

## 2011-05-29 DIAGNOSIS — Z8673 Personal history of transient ischemic attack (TIA), and cerebral infarction without residual deficits: Secondary | ICD-10-CM | POA: Insufficient documentation

## 2011-05-29 DIAGNOSIS — I2510 Atherosclerotic heart disease of native coronary artery without angina pectoris: Secondary | ICD-10-CM | POA: Insufficient documentation

## 2011-05-29 DIAGNOSIS — R259 Unspecified abnormal involuntary movements: Secondary | ICD-10-CM | POA: Insufficient documentation

## 2011-05-29 DIAGNOSIS — F411 Generalized anxiety disorder: Secondary | ICD-10-CM | POA: Insufficient documentation

## 2011-05-29 DIAGNOSIS — N390 Urinary tract infection, site not specified: Secondary | ICD-10-CM | POA: Insufficient documentation

## 2011-05-29 DIAGNOSIS — R0602 Shortness of breath: Secondary | ICD-10-CM | POA: Insufficient documentation

## 2011-05-29 DIAGNOSIS — Z79899 Other long term (current) drug therapy: Secondary | ICD-10-CM | POA: Insufficient documentation

## 2011-05-29 DIAGNOSIS — F419 Anxiety disorder, unspecified: Secondary | ICD-10-CM

## 2011-05-29 DIAGNOSIS — Z7982 Long term (current) use of aspirin: Secondary | ICD-10-CM | POA: Insufficient documentation

## 2011-05-29 DIAGNOSIS — E119 Type 2 diabetes mellitus without complications: Secondary | ICD-10-CM | POA: Insufficient documentation

## 2011-05-29 DIAGNOSIS — I10 Essential (primary) hypertension: Secondary | ICD-10-CM | POA: Insufficient documentation

## 2011-05-29 HISTORY — DX: Anxiety disorder, unspecified: F41.9

## 2011-05-29 LAB — DIFFERENTIAL
Basophils Absolute: 0 10*3/uL (ref 0.0–0.1)
Eosinophils Relative: 2 % (ref 0–5)
Lymphocytes Relative: 10 % — ABNORMAL LOW (ref 12–46)
Monocytes Relative: 7 % (ref 3–12)

## 2011-05-29 LAB — URINALYSIS, ROUTINE W REFLEX MICROSCOPIC
Glucose, UA: 100 mg/dL — AB
Hgb urine dipstick: NEGATIVE
Ketones, ur: NEGATIVE mg/dL
Protein, ur: NEGATIVE mg/dL

## 2011-05-29 LAB — CBC
HCT: 50 % (ref 39.0–52.0)
MCV: 95.4 fL (ref 78.0–100.0)
Platelets: 121 10*3/uL — ABNORMAL LOW (ref 150–400)
RBC: 5.24 MIL/uL (ref 4.22–5.81)
RDW: 12.7 % (ref 11.5–15.5)
WBC: 7.8 10*3/uL (ref 4.0–10.5)

## 2011-05-29 LAB — COMPREHENSIVE METABOLIC PANEL
ALT: 11 U/L (ref 0–53)
AST: 15 U/L (ref 0–37)
Alkaline Phosphatase: 104 U/L (ref 39–117)
CO2: 28 mEq/L (ref 19–32)
Chloride: 99 mEq/L (ref 96–112)
GFR calc Af Amer: >90 mL/min (ref >90)
GFR calc non Af Amer: >90 mL/min (ref >90)
Glucose, Bld: 149 mg/dL — ABNORMAL HIGH (ref 70–99)
Sodium: 138 mEq/L (ref 135–145)
Total Bilirubin: 0.5 mg/dL (ref 0.3–1.2)

## 2011-05-29 LAB — GLUCOSE, CAPILLARY: Glucose-Capillary: 174 mg/dL — ABNORMAL HIGH (ref 70–99)

## 2011-05-29 LAB — CARDIAC PANEL(CRET KIN+CKTOT+MB+TROPI)
Relative Index: INVALID (ref 0.0–2.5)
Total CK: 43 U/L (ref 7–232)

## 2011-05-29 LAB — URINE MICROSCOPIC-ADD ON

## 2011-05-29 MED ORDER — RAMIPRIL 5 MG PO CAPS: 5.0000 mg | ORAL_CAPSULE | Freq: Every day | ORAL | Status: DC

## 2011-05-29 MED ORDER — METOPROLOL SUCCINATE ER 25 MG PO TB24: 50.0000 mg | ORAL_TABLET | Freq: Every day | ORAL | Status: DC

## 2011-05-29 MED ORDER — ALPRAZOLAM 1 MG PO TABS: 1.0000 mg | ORAL_TABLET | Freq: Three times a day (TID) | ORAL | Status: DC | PRN

## 2011-05-29 MED ORDER — CIPROFLOXACIN HCL 500 MG PO TABS: 500.0000 mg | ORAL_TABLET | Freq: Two times a day (BID) | ORAL | Status: DC

## 2011-05-29 MED ORDER — LORAZEPAM 2 MG/ML IJ SOLN
0.5000 mg | Freq: Once | INTRAMUSCULAR | Status: AC
  Administered 2011-05-29: 0.5 mg via INTRAVENOUS
  Filled 2011-05-29: qty 1

## 2011-05-29 NOTE — ED Notes (Signed)
[id number]<AV> Expected date:05/29/11<BR> Expected time: 8:52 AM<BR> Means of arrival:Ambulance<BR> Comments:<BR> Anxiety/out of meds

## 2011-05-29 NOTE — ED Notes (Signed)
YQM:[id number]<NG> Expected date:05/29/11<BR> Expected time: 2:17 PM<BR> Means of arrival:Ambulance<BR> Comments:<BR> GC M20. 60 yo f. [facility]. Sob/productive cough, fever. 15 min eta

## 2011-05-29 NOTE — ED Notes (Signed)
Pt reports waking up feeling SOB. Ems found patient at 99% on room air. Also has leg tremors this morning and self medicated with anxiety meds. Tremors noted at triage, but subsided at this moment.

## 2011-05-29 NOTE — ED Notes (Signed)
Per ems, pt from home. Woke up this morning with leg tremers, and anxiety and sob. Sat 99% on room air. Leg tremors persist in room. Left side weakness from stroke 5 years ago

## 2011-05-29 NOTE — ED Notes (Signed)
MD at bedside. 

## 2011-05-29 NOTE — ED Notes (Signed)
Pt informed that we need urine. Pt using urinal right now

## 2011-05-29 NOTE — ED Notes (Signed)
Urinal given to patient.

## 2011-05-29 NOTE — ED Provider Notes (Signed)
History     CSN: [id number]  Arrival date & time 05/29/11  0906   First MD Initiated Contact with Patient 05/29/11 [phone]      Chief Complaint  Patient presents with  . Shortness of Breath  . Anxiety    (Consider location/radiation/quality/duration/timing/severity/associated sxs/prior treatment) HPI  29yoM h/o CVA with residual Rt weakness, CAD s/p CABG, etoh abuse pw tremors and anxiety. Patient states he woke up this morning in his bilateral lower extremities were tremoring at that time. States he has mild tremors of his upper extremities at that time as well. The patient was alert throughout this entire episode. He states it resolved just prior to arrival. He states he usually does not have these symptoms with his anxiety but he did feel anxious this morning was complaining of shortness of breath and feelings of anxiety when he woke up. He denies any chest pain, back pain. He denies nausea, vomiting, diaphoresis. He denies abdominal pain. Denies hematuria/dysuria/freq/urgency. Denies history of seizure disorder. States that he has been out of all of his medications including his Xanax for the past several days. Requesting for refills of his medication.  States his last beer was 2 days ago but that "I don't drink every day anymore". Denies depression/SI/HI. Has chronic visual hallucinations "seeing people" which is at baseline. Has chronic back pain which is at baseline  ED Notes, ED Provider Notes from 05/29/11 0000 to 05/29/11 09:09:05       Lucia Estelle, RN 05/29/2011 09:07      Per ems, pt from home. Woke up this morning with leg tremers, and anxiety and sob. Sat 99% on room air. Leg tremors persist in room. Left side weakness from stroke 5 years ago    Past Medical History  Diagnosis Date  . Coronary artery disease   . Hypertension   . Diabetes mellitus   . Stroke   . Anxiety     Past Surgical History  Procedure Date  . Cardiac surgery     CABG  . Carotid endarterectomy       left side    No family history on file.  History  Substance Use Topics  . Smoking status: Current Everyday Smoker  . Smokeless tobacco: Not on file  . Alcohol Use: Yes     occasionally    Review of Systems  All other systems reviewed and are negative.  except as noted HPI   Allergies  Penicillins  Home Medications   Current Outpatient Rx  Name Route Sig Dispense Refill  . ALPRAZOLAM 1 MG PO TABS Oral Take 1 mg by mouth 3 (three) times daily as needed. FOR ANEXITY     . ASPIRIN-SALICYLAMIDE-CAFFEINE 119-147-82 MG PO PACK Oral Take 1 Package by mouth daily as needed. As needed for pain     . MELOXICAM 15 MG PO TABS Oral Take 15 mg by mouth daily.      Marland Kitchen METOPROLOL SUCCINATE ER 50 MG PO TB24 Oral Take 50 mg by mouth daily.      . CENTRUM SILVER ULTRA MENS PO Oral Take 1 tablet by mouth daily.      Marland Kitchen PAROXETINE HCL 30 MG PO TABS Oral Take 30 mg by mouth every morning.      Marland Kitchen RAMIPRIL 5 MG PO CAPS Oral Take 5 mg by mouth daily.      Marland Kitchen ALPRAZOLAM 1 MG PO TABS Oral Take 1 tablet (1 mg total) by mouth 3 (three) times daily as needed for anxiety. 30  tablet 0  . CIPROFLOXACIN HCL 500 MG PO TABS Oral Take 1 tablet (500 mg total) by mouth 2 (two) times daily. 14 tablet 0  . METOPROLOL SUCCINATE ER 25 MG PO TB24 Oral Take 2 tablets (50 mg total) by mouth daily. 30 tablet 0  . RAMIPRIL 5 MG PO CAPS Oral Take 1 capsule (5 mg total) by mouth daily. 30 capsule 0    BP 163/73  Pulse 68  Temp(Src) 98.2 F (36.8 C) (Oral)  Resp 18  SpO2 93%  Physical Exam  Nursing note and vitals reviewed. Constitutional: He is oriented to person, place, and time. He appears well-developed and well-nourished. No distress.  HENT:  Head: Atraumatic.  Mouth/Throat: Oropharynx is clear and moist.  Eyes: Conjunctivae are normal. Pupils are equal, round, and reactive to light.  Neck: Neck supple.  Cardiovascular: Normal rate, regular rhythm, normal heart sounds and intact distal pulses.  Exam reveals  no gallop and no friction rub.   No murmur heard. Pulmonary/Chest: Effort normal. No respiratory distress. He has no wheezes. He has no rales.  Abdominal: Soft. Bowel sounds are normal. There is no tenderness. There is no rebound and no guarding.  Musculoskeletal: Normal range of motion. He exhibits no edema and no tenderness.  Neurological: He is alert and oriented to person, place, and time. No cranial nerve deficit. He exhibits normal muscle tone. Coordination normal.       Strength 5/5 LUE/LLE extremities Strength 4+/5 RUE/RLE No pronator drift No facial droop Actively shaking legs in room but able to stop, distinguished by holding legs onto bed   Skin: Skin is warm and dry.  Psychiatric: He has a normal mood and affect.    Date: 05/29/2011  Rate: 71  Rhythm: normal sinus rhythm  QRS Axis: normal  Intervals: normal  ST/T Wave abnormalities: normal  Conduction Disutrbances:none  Narrative Interpretation:   Old EKG Reviewed: unchanged   ED Course  Procedures (including critical care time)  Labs Reviewed  CBC - Abnormal; Notable for the following:    Platelets 121 (*)    All other components within normal limits  DIFFERENTIAL - Abnormal; Notable for the following:    Neutrophils Relative 81 (*)    Lymphocytes Relative 10 (*)    All other components within normal limits  COMPREHENSIVE METABOLIC PANEL - Abnormal; Notable for the following:    Glucose, Bld 149 (*)    All other components within normal limits  URINALYSIS, ROUTINE W REFLEX MICROSCOPIC - Abnormal; Notable for the following:    Glucose, UA 100 (*)    Bilirubin Urine SMALL (*)    Nitrite POSITIVE (*)    All other components within normal limits  GLUCOSE, CAPILLARY - Abnormal; Notable for the following:    Glucose-Capillary 174 (*)    All other components within normal limits  URINE MICROSCOPIC-ADD ON - Abnormal; Notable for the following:    Bacteria, UA MANY (*)    All other components within normal limits   ETHANOL  CARDIAC PANEL(CRET KIN+CKTOT+MB+TROPI)  URINE CULTURE   Dg Chest 2 View  05/29/2011  *RADIOLOGY REPORT*  Clinical Data: Short of breath.  Ex-smoker.  CHEST - 2 VIEW  Comparison: 03/19/2009.  Findings: Cardiomegaly.  Postoperative changes of CABG.  Lungs appear clear.  No airspace disease.  No effusion.  No edema. Monitoring leads are projected over the chest.  IMPRESSION: Mild cardiomegaly and postoperative changes of CABG without acute cardiopulmonary disease.  Original Report Authenticated By: Andreas Newport, M.D.  1. UTI (lower urinary tract infection)   2. Anxiety     MDM  The patient feels that this is his anxiety as he has run out of his xanax. He did have tremors here which he was able to control on demand. He was awake and alert. I do not suspect seizure, ACS, or PE as cause of his sx. VSS- O2 sats noted by me to be 97%RA prior to discharge. No acute abnormality on CXR. Patient feeling better after ativan. Will discharge with refill for his xanax, metoprolol, ramipril. Cipro for UTI. Aware that he needs to follow up with his PMD.          Forbes Cellar, MD 05/30/11 [phone]

## 2011-05-30 ENCOUNTER — Emergency Department (HOSPITAL_COMMUNITY)
Admission: EM | Admit: 2011-05-30 | Discharge: 2011-05-30 | Disposition: A | Discharge status: Home / Self Care | Payer: Medicare Other | Attending: Emergency Medicine | Admitting: Emergency Medicine

## 2011-05-30 ENCOUNTER — Emergency Department (HOSPITAL_COMMUNITY): Payer: Medicare Other

## 2011-05-30 ENCOUNTER — Encounter (HOSPITAL_COMMUNITY): Payer: Self-pay | Admitting: Neurology

## 2011-05-30 DIAGNOSIS — R51 Headache: Secondary | ICD-10-CM

## 2011-05-30 DIAGNOSIS — H539 Unspecified visual disturbance: Secondary | ICD-10-CM | POA: Insufficient documentation

## 2011-05-30 DIAGNOSIS — I10 Essential (primary) hypertension: Secondary | ICD-10-CM | POA: Insufficient documentation

## 2011-05-30 DIAGNOSIS — F17200 Nicotine dependence, unspecified, uncomplicated: Secondary | ICD-10-CM | POA: Insufficient documentation

## 2011-05-30 DIAGNOSIS — Z8673 Personal history of transient ischemic attack (TIA), and cerebral infarction without residual deficits: Secondary | ICD-10-CM | POA: Insufficient documentation

## 2011-05-30 HISTORY — DX: Alcohol abuse, uncomplicated: F10.10

## 2011-05-30 LAB — COMPREHENSIVE METABOLIC PANEL
Alkaline Phosphatase: 101 U/L (ref 39–117)
BUN: 5 mg/dL — ABNORMAL LOW (ref 6–23)
CO2: 28 mEq/L (ref 19–32)
Chloride: 96 mEq/L (ref 96–112)
GFR calc Af Amer: >90 mL/min (ref >90)
GFR calc non Af Amer: >90 mL/min (ref >90)
Glucose, Bld: 164 mg/dL — ABNORMAL HIGH (ref 70–99)
Potassium: 4.2 mEq/L (ref 3.5–5.1)
Total Bilirubin: 0.6 mg/dL (ref 0.3–1.2)

## 2011-05-30 LAB — CK TOTAL AND CKMB (NOT AT ARMC): Total CK: 67 U/L (ref 7–232)

## 2011-05-30 LAB — CBC
HCT: 50.9 % (ref 39.0–52.0)
Hemoglobin: 18.2 g/dL — ABNORMAL HIGH (ref 13.0–17.0)
MCH: 33.9 pg (ref 26.0–34.0)
RBC: 5.37 MIL/uL (ref 4.22–5.81)

## 2011-05-30 LAB — POCT I-STAT, CHEM 8
BUN: 3 mg/dL — ABNORMAL LOW (ref 6–23)
Calcium, Ion: 1.06 mmol/L — ABNORMAL LOW (ref 1.12–1.32)
Creatinine, Ser: 1 mg/dL (ref 0.50–1.35)
Sodium: 138 mEq/L (ref 135–145)
TCO2: 29 mmol/L (ref 0–100)

## 2011-05-30 LAB — DIFFERENTIAL
Lymphs Abs: 1.3 10*3/uL (ref 0.7–4.0)
Monocytes Relative: 6 % (ref 3–12)
Neutro Abs: 7.2 10*3/uL (ref 1.7–7.7)
Neutrophils Relative %: 78 % — ABNORMAL HIGH (ref 43–77)

## 2011-05-30 LAB — PROTIME-INR: INR: 0.97 (ref 0.00–1.49)

## 2011-05-30 LAB — APTT: aPTT: 30 seconds (ref 24–37)

## 2011-05-30 MED ORDER — ONDANSETRON HCL 4 MG/2ML IJ SOLN
4.0000 mg | Freq: Once | INTRAMUSCULAR | Status: AC
  Administered 2011-05-30: 4 mg via INTRAVENOUS
  Filled 2011-05-30: qty 2

## 2011-05-30 MED ORDER — SODIUM CHLORIDE 0.9 % IV SOLN
INTRAVENOUS | Status: DC
  Administered 2011-05-30: 17:00:00 via INTRAVENOUS

## 2011-05-30 MED ORDER — MORPHINE SULFATE 4 MG/ML IJ SOLN
4.0000 mg | Freq: Once | INTRAMUSCULAR | Status: AC
  Administered 2011-05-30: 4 mg via INTRAVENOUS
  Filled 2011-05-30: qty 1

## 2011-05-30 MED ORDER — LISINOPRIL 10 MG PO TABS
10.0000 mg | ORAL_TABLET | Freq: Once | ORAL | Status: AC
  Administered 2011-05-30: 10 mg via ORAL
  Filled 2011-05-30: qty 1

## 2011-05-30 NOTE — ED Notes (Signed)
Stroke team at bedside assessing pt. Pt talking, responding appropriately.

## 2011-05-30 NOTE — ED Notes (Signed)
Pt picked up by [facility].  

## 2011-05-30 NOTE — ED Provider Notes (Signed)
History     CSN: [id number]  Arrival date & time 05/30/11  1426   First MD Initiated Contact with Patient 05/30/11 1432      Chief Complaint  Patient presents with  . Code Stroke    (Consider location/radiation/quality/duration/timing/severity/associated sxs/prior treatment) The history is provided by the patient and the EMS personnel.   the patient is a 60 year old, male, with a history of stroke, and residual right-sided weakness, who presents to the emergency department complaining of blurred vision in his right visual field.  He also has a headache.  He denies nausea, vomiting, fevers, chills, cough, shortness of breath, abdominal pain.  He has not been sick recently.  He has not changed any medications recently. Stroke was initially called.  Past Medical History  Diagnosis Date  . Coronary artery disease   . Hypertension   . Diabetes mellitus   . Stroke   . Anxiety   . Substance abuse   . Alcohol abuse     Past Surgical History  Procedure Date  . Cardiac surgery     CABG  . Carotid endarterectomy     left side    No family history on file.  History  Substance Use Topics  . Smoking status: Current Everyday Smoker  . Smokeless tobacco: Not on file  . Alcohol Use: Yes     occasionally      Review of Systems  Constitutional: Negative for fever and diaphoresis.  HENT: Negative for neck pain.   Eyes: Positive for visual disturbance. Negative for pain and redness.  Respiratory: Negative for cough, chest tightness and shortness of breath.   Cardiovascular: Negative for chest pain and palpitations.  Gastrointestinal: Negative for nausea, vomiting, abdominal pain and diarrhea.  Musculoskeletal: Negative for back pain.  Neurological: Positive for headaches.  Psychiatric/Behavioral: Negative for confusion.    Allergies  Penicillins  Home Medications   Current Outpatient Rx  Name Route Sig Dispense Refill  . ALPRAZOLAM 1 MG PO TABS Oral Take 1 mg by mouth  3 (three) times daily as needed. FOR Anxiety    . MELOXICAM 15 MG PO TABS Oral Take 15 mg by mouth daily.      Marland Kitchen METOPROLOL SUCCINATE ER 25 MG PO TB24 Oral Take 25 mg by mouth daily.      . CENTRUM SILVER ULTRA MENS PO Oral Take 1 tablet by mouth daily.      Marland Kitchen PAROXETINE HCL 30 MG PO TABS Oral Take 30 mg by mouth every morning.      Marland Kitchen RAMIPRIL 5 MG PO CAPS Oral Take 5 mg by mouth daily.      . ASPIRIN-SALICYLAMIDE-CAFFEINE 409-811-91 MG PO PACK Oral Take 1 Package by mouth daily as needed. As needed for pain     . CIPROFLOXACIN HCL 500 MG PO TABS Oral Take 1 tablet (500 mg total) by mouth 2 (two) times daily. 14 tablet 0    BP 171/79  Pulse 70  Temp(Src) 98.5 F (36.9 C) (Oral)  Resp 18  SpO2 97%  Physical Exam  Constitutional: He is oriented to person, place, and time. He appears well-developed and well-nourished.  HENT:  Head: Normocephalic and atraumatic.  Eyes: Conjunctivae are normal. Pupils are equal, round, and reactive to light.  Neck: Normal range of motion. Neck supple.  Cardiovascular: Normal rate and regular rhythm.   No murmur heard. Pulmonary/Chest: Effort normal and breath sounds normal.  Abdominal: Soft. Bowel sounds are normal.  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 Pupils are equal, and reactive.  Extraocular motor intact.  He is able to count my fingers while looking with monocular vision with right eye.  He was accurate counting fingers and tracking my finger, when the left eye was covered.  Neurological: He is alert and oriented to person, place, and time.  Skin: Skin is warm and dry.  Psychiatric: He has a normal mood and affect.    ED Course  Procedures (including critical care time) 60 year old, male, with a history of stroke, presents with a headache, and blurred vision in the right visual field.  There is no evidence that he has a new stroke.  He is able to count fingers in track with right eye.  He was seen by Dr. Thad Ranger, who does not  think he has a stroke either.  We will treat his headache and then releas.  Labs Reviewed  CBC - Abnormal; Notable for the following:    Hemoglobin 18.2 (*)    Platelets 126 (*)    All other components within normal limits  DIFFERENTIAL - Abnormal; Notable for the following:    Neutrophils Relative 78 (*)    All other components within normal limits  COMPREHENSIVE METABOLIC PANEL - Abnormal; Notable for the following:    Glucose, Bld 164 (*)    BUN 5 (*)    All other components within normal limits  POCT I-STAT, CHEM 8 - Abnormal; Notable for the following:    BUN 3 (*)    Glucose, Bld 165 (*)    Calcium, Ion 1.06 (*)    Hemoglobin 19.0 (*)    HCT 56.0 (*)    All other components within normal limits  PROTIME-INR  APTT  CK TOTAL AND CKMB  TROPONIN I   Dg Chest 2 View  05/29/2011  *RADIOLOGY REPORT*  Clinical Data: Short of breath.  Ex-smoker.  CHEST - 2 VIEW  Comparison: 03/19/2009.  Findings: Cardiomegaly.  Postoperative changes of CABG.  Lungs appear clear.  No airspace disease.  No effusion.  No edema. Monitoring leads are projected over the chest.  IMPRESSION: Mild cardiomegaly and postoperative changes of CABG without acute cardiopulmonary disease.  Original Report Authenticated By: Andreas Newport, M.D.   Ct Head Wo Contrast  05/30/2011  *RADIOLOGY REPORT*  Clinical Data: Code stroke.  New onset blurred vision  CT HEAD WITHOUT CONTRAST  Technique:  Contiguous axial images were obtained from the base of the skull through the vertex without contrast.  Comparison: CT 01/07/2011  Findings: Generalized atrophy.  Chronic parietal lobe infarcts bilaterally, left greater than right.  These are similar to the prior study.  Chronic infarct right thalamus, unchanged.  Negative for acute infarct.  Negative for hemorrhage or mass.  IMPRESSION: Chronic ischemic changes in the parietal lobe bilaterally.  No acute infarct.  Original Report Authenticated By: Camelia Phenes, M.D.     No  diagnosis found.    MDM   Headache hypertension        Nicholes Stairs, MD 05/30/11 [phone]

## 2011-05-30 NOTE — ED Notes (Signed)
PER EMS-Comes from home. Initially c/o blurred vision in right eye. Pt reporting laid down at 1300 without blurry symptoms, woke up with blurry vision. Right sided deficits from stroke 2 years ago. Only neuro deficit present is right sided blurry vision. Pupils equal and reactive. Speech and mentation normal. Family reporting concern about abusing Xanax. Pt seen at [facility] yesterday c/o shakiness. Pt 194/89, pt non-compliant with HTN med, CBG 162. 20 gauge L. AC.

## 2011-05-30 NOTE — ED Notes (Signed)
CBG 128 

## 2011-05-30 NOTE — ED Provider Notes (Signed)
The patient notes that his HA is "better".  His PMD also called, and the case was d/w him.  He notes that he is available for F/U, and the importance of appropriate f/u care was d/w the patient who was d/c in stable condition.  Gerhard Munch, MD 05/30/11 1740

## 2011-05-30 NOTE — Consult Note (Signed)
Referring Physician: Cap[place]    Chief Complaint: Blurred vision from the right eye  HPI: Melvin Krueger is an 60 y.o. male that reports that he laid down for a nap around 1pm.  When he awakened after the nap he reported blurred vision from his right eye.  He reports this has improved since admission.  Has history of an old stroke affecting the right side.  Initial NIHSS of 2  LSN: 1300 tPA Given: No: Minimal acute findings with resolution of symptoms  Past Medical History  Diagnosis Date  . Coronary artery disease   . Hypertension   . Diabetes mellitus   . Stroke   . Anxiety   . Substance abuse   . Alcohol abuse     Past Surgical History  Procedure Date  . Cardiac surgery     CABG  . Carotid endarterectomy     left side    No family history on file. Social History:  reports that he has been smoking.  He does not have any smokeless tobacco history on file. He reports that he drinks alcohol. He reports that he does not use illicit drugs.  Allergies:  Allergies  Allergen Reactions  . Penicillins Rash    Medications: I have reviewed the patient's current medications. Prior to Admission:  Xanax (patient reports taking 4 or more per day but has recently run out), BC, Cipro, Mobic, Toprol, Centrum Silver, Paxil, Altace  ROS: History obtained from the patient  General ROS: negative for - chills, fatigue, fever, night sweats, weight gain or weight loss Psychological ROS: negative for - behavioral disorder, hallucinations, memory difficulties, mood swings or suicidal ideation Ophthalmic ROS: blurry vision right eye, ENT ROS: negative for - epistaxis, nasal discharge, oral lesions, sore throat, tinnitus or vertigo Allergy and Immunology ROS: negative for - hives or itchy/watery eyes Hematological and Lymphatic ROS: negative for - bleeding problems, bruising or swollen lymph nodes Endocrine ROS: negative for - galactorrhea, hair pattern changes, polydipsia/polyuria or  temperature intolerance Respiratory ROS: negative for - cough, hemoptysis, shortness of breath or wheezing Cardiovascular ROS: negative for - chest pain, dyspnea on exertion, edema or irregular heartbeat Gastrointestinal ROS: negative for - abdominal pain, diarrhea, hematemesis, nausea/vomiting or stool incontinence Genito-Urinary ROS: negative for - dysuria, hematuria, incontinence or urinary frequency/urgency Musculoskeletal ROS: negative for - joint swelling or muscular weakness Neurological ROS: as noted in HPI, right hemiparesis Dermatological ROS: negative for rash and skin lesion changes  Physical Examination: Blood pressure 179/84, pulse 68, temperature 98.5 F (36.9 C), temperature source Oral, resp. rate 20, SpO2 98.00%.  Neurologic Examination: Mental Status: Alert, oriented, thought content appropriate.  Speech fluent without evidence of aphasia.  Able to follow 3 step commands without difficulty. Cranial Nerves: II: visual fields grossly normal, pupils equal, round, reactive to light and accommodation III,IV, VI: ptosis not present, extra-ocular motions intact bilaterally V,VII: smile symmetric, facial light touch sensation normal bilaterally VIII: hearing normal bilaterally IX,X: gag reflex present XI: trapezius strength decreased on the right XII: tongue strength normal  Motor: Right : Upper extremity   4+/5    Left:     Upper extremity   5/5  Lower extremity   5-/5      Lower extremity   5/5 Tone increased on the right Sensory: Pinprick and light touch intact throughout, bilaterally Deep Tendon Reflexes: 1+ in the upper extremities.  Absent in the lower extremities Plantars: Right: upgoing   Left: upgoing Cerebellar: Dysmetria with finger-to-nose on the right.  Intact on the left.  Difficulty with heel to shin bilaterally   Results for orders placed during the hospital encounter of 05/30/11 (from the past 48 hour(s))  CBC     Status: Abnormal   Collection Time    05/30/11  2:26 PM      Component Value Range Comment   WBC 9.2  4.0 - 10.5 (K/uL)    RBC 5.37  4.22 - 5.81 (MIL/uL)    Hemoglobin 18.2 (*) 13.0 - 17.0 (g/dL)    HCT 40.9  81.1 - 91.4 (%)    MCV 94.8  78.0 - 100.0 (fL)    MCH 33.9  26.0 - 34.0 (pg)    MCHC 35.8  30.0 - 36.0 (g/dL)    RDW 78.2  95.6 - 21.3 (%)    Platelets 126 (*) 150 - 400 (K/uL)   DIFFERENTIAL     Status: Abnormal   Collection Time   05/30/11  2:26 PM      Component Value Range Comment   Neutrophils Relative 78 (*) 43 - 77 (%)    Neutro Abs 7.2  1.7 - 7.7 (K/uL)    Lymphocytes Relative 14  12 - 46 (%)    Lymphs Abs 1.3  0.7 - 4.0 (K/uL)    Monocytes Relative 6  3 - 12 (%)    Monocytes Absolute 0.6  0.1 - 1.0 (K/uL)    Eosinophils Relative 1  0 - 5 (%)    Eosinophils Absolute 0.1  0.0 - 0.7 (K/uL)    Basophils Relative 1  0 - 1 (%)    Basophils Absolute 0.1  0.0 - 0.1 (K/uL)   POCT I-STAT, CHEM 8     Status: Abnormal   Collection Time   05/30/11  2:37 PM      Component Value Range Comment   Sodium 138  135 - 145 (mEq/L)    Potassium 4.1  3.5 - 5.1 (mEq/L)    Chloride 98  96 - 112 (mEq/L)    BUN 3 (*) 6 - 23 (mg/dL)    Creatinine, Ser 0.86  0.50 - 1.35 (mg/dL)    Glucose, Bld 578 (*) 70 - 99 (mg/dL)    Calcium, Ion 4.69 (*) 1.12 - 1.32 (mmol/L)    TCO2 29  0 - 100 (mmol/L)    Hemoglobin 19.0 (*) 13.0 - 17.0 (g/dL)    HCT 62.9 (*) 52.8 - 52.0 (%)    Dg Chest 2 View  05/29/2011  *RADIOLOGY REPORT*  Clinical Data: Short of breath.  Ex-smoker.  CHEST - 2 VIEW  Comparison: 03/19/2009.  Findings: Cardiomegaly.  Postoperative changes of CABG.  Lungs appear clear.  No airspace disease.  No effusion.  No edema. Monitoring leads are projected over the chest.  IMPRESSION: Mild cardiomegaly and postoperative changes of CABG without acute cardiopulmonary disease.  Original Report Authenticated By: Andreas Newport, M.D.    Assessment: 60 y.o. male presenting with right eye blurred vision that is resolving.  History of  stroke.  CT shows old infarcts but no new events.  BP elevated.  Doubt new ischemic event.  Symptoms may have been related to elevated BP.  Stroke Risk Factors - stroke in the past, diabetes mellitus, hypertension, smoking and CAD  Plan: 1. D/C BC use 2. MRI of the brain.  Would only entertain stroke work up if imaging shows an acute infarct.  3. ASA 325mg  daily    Thana Farr, MD [facility] [phone] 05/30/2011, 2:58 PM

## 2011-05-30 NOTE — ED Notes (Addendum)
Code stroke called 1406. Pt arrival 1420. EDP exam 1421. Stroke team arrival 1410. Last seen normal 1300. Pt arrival in CT 1425. Phlebotomist arrived 1408. Code Stroke cancelled 1457 by Dr. Thad Ranger.

## 2011-05-30 NOTE — ED Notes (Signed)
Pt had episode of vomiting. Vomit was green, small amount. Pt medicated per EDP orders with Zofran. Pt alert and oriented. C/o headache

## 2011-05-31 LAB — URINE CULTURE: Colony Count: >=100000

## 2011-06-01 NOTE — ED Notes (Signed)
Rx for Macrobid 100 mg po q 12 hours x 7 days.Follow up with PCP by Felicie Morn.  Need to be called to [facility].called by [facility] PFM.

## 2011-06-01 NOTE — ED Notes (Signed)
+   urine Patient  Treated with Cipro-resistant-chart sent to EDP office for review.

## 2011-06-07 ENCOUNTER — Encounter (HOSPITAL_COMMUNITY): Payer: Self-pay | Admitting: Emergency Medicine

## 2011-06-07 ENCOUNTER — Emergency Department (HOSPITAL_COMMUNITY): Payer: Medicare Other

## 2011-06-07 ENCOUNTER — Emergency Department (HOSPITAL_COMMUNITY)
Admission: EM | Admit: 2011-06-07 | Discharge: 2011-06-08 | Disposition: A | Discharge status: Home / Self Care | Payer: Medicare Other | Attending: Emergency Medicine | Admitting: Emergency Medicine

## 2011-06-07 DIAGNOSIS — N390 Urinary tract infection, site not specified: Secondary | ICD-10-CM | POA: Insufficient documentation

## 2011-06-07 DIAGNOSIS — Z8673 Personal history of transient ischemic attack (TIA), and cerebral infarction without residual deficits: Secondary | ICD-10-CM | POA: Insufficient documentation

## 2011-06-07 DIAGNOSIS — I2510 Atherosclerotic heart disease of native coronary artery without angina pectoris: Secondary | ICD-10-CM | POA: Insufficient documentation

## 2011-06-07 DIAGNOSIS — Z79899 Other long term (current) drug therapy: Secondary | ICD-10-CM | POA: Insufficient documentation

## 2011-06-07 DIAGNOSIS — E119 Type 2 diabetes mellitus without complications: Secondary | ICD-10-CM | POA: Insufficient documentation

## 2011-06-07 DIAGNOSIS — R112 Nausea with vomiting, unspecified: Secondary | ICD-10-CM | POA: Insufficient documentation

## 2011-06-07 DIAGNOSIS — I10 Essential (primary) hypertension: Secondary | ICD-10-CM | POA: Insufficient documentation

## 2011-06-07 DIAGNOSIS — Z7982 Long term (current) use of aspirin: Secondary | ICD-10-CM | POA: Insufficient documentation

## 2011-06-07 LAB — CBC
MCH: 32.7 pg (ref 26.0–34.0)
MCV: 90.8 fL (ref 78.0–100.0)
Platelets: 231 10*3/uL (ref 150–400)
RBC: 5.75 MIL/uL (ref 4.22–5.81)

## 2011-06-07 LAB — DIFFERENTIAL
Eosinophils Absolute: 0 10*3/uL (ref 0.0–0.7)
Eosinophils Relative: 0 % (ref 0–5)
Lymphs Abs: 1.2 10*3/uL (ref 0.7–4.0)
Monocytes Relative: 6 % (ref 3–12)

## 2011-06-07 MED ORDER — SODIUM CHLORIDE 0.9 % IV BOLUS (SEPSIS)
500.0000 mL | Freq: Once | INTRAVENOUS | Status: AC
  Administered 2011-06-08: 500 mL via INTRAVENOUS

## 2011-06-07 MED ORDER — ONDANSETRON HCL 4 MG/2ML IJ SOLN
4.0000 mg | Freq: Once | INTRAMUSCULAR | Status: AC
  Administered 2011-06-08: 4 mg via INTRAVENOUS
  Filled 2011-06-07: qty 2

## 2011-06-07 NOTE — ED Provider Notes (Signed)
History     CSN: [id number]  Arrival date & time 06/07/11  2231   First MD Initiated Contact with Patient 06/07/11 2320      Chief Complaint  Patient presents with  . Emesis    Patient is a 60 y.o. male presenting with vomiting. The history is provided by the patient.  Emesis  This is a new problem. The current episode started 3 to 5 hours ago. The problem has been gradually improving. The emesis has an appearance of stomach contents. There has been no fever. Pertinent negatives include no abdominal pain, no chills, no diarrhea, no fever and no headaches.  improved by - nothing Worsened by - nothing  Pt reports he went to bed early, woke up with vomiting (nonbloody) No diarrhea No cp/sob/abd pain/back pain No cough No HA No new weakness (residual weakness in right UE from previous CVA) He had otherwise been well recently  Past Medical History  Diagnosis Date  . Coronary artery disease   . Hypertension   . Diabetes mellitus   . Stroke   . Anxiety   . Substance abuse   . Alcohol abuse     Past Surgical History  Procedure Date  . Cardiac surgery     CABG  . Carotid endarterectomy     left side    History reviewed. No pertinent family history.  History  Substance Use Topics  . Smoking status: Current Everyday Smoker  . Smokeless tobacco: Not on file  . Alcohol Use: Yes     occasionally      Review of Systems  Constitutional: Negative for fever and chills.  Gastrointestinal: Positive for vomiting. Negative for abdominal pain and diarrhea.  Neurological: Negative for headaches.  All other systems reviewed and are negative.    Allergies  Penicillins  Home Medications   Current Outpatient Rx  Name Route Sig Dispense Refill  . ALPRAZOLAM 1 MG PO TABS Oral Take 1 mg by mouth 3 (three) times daily as needed. FOR Anxiety    . ASPIRIN-SALICYLAMIDE-CAFFEINE 657-846-96 MG PO PACK Oral Take 1 Package by mouth daily as needed. As needed for pain     .  CIPROFLOXACIN HCL 500 MG PO TABS Oral Take 1 tablet (500 mg total) by mouth 2 (two) times daily. 14 tablet 0  . MELOXICAM 15 MG PO TABS Oral Take 15 mg by mouth daily.      Marland Kitchen METOPROLOL SUCCINATE ER 25 MG PO TB24 Oral Take 25 mg by mouth daily.      . CENTRUM SILVER ULTRA MENS PO Oral Take 1 tablet by mouth daily.      Marland Kitchen PAROXETINE HCL 30 MG PO TABS Oral Take 30 mg by mouth every morning.      Marland Kitchen RAMIPRIL 5 MG PO CAPS Oral Take 5 mg by mouth daily.        BP 158/76  Pulse 57  Temp(Src) 98 F (36.7 C) (Oral)  Resp 12  SpO2 94%  Physical Exam CONSTITUTIONAL: Well developed/well nourished HEAD AND FACE: Normocephalic/atraumatic EYES: EOMI/PERRL ENMT: Mucous membranes moist NECK: supple no meningeal signs SPINE:entire spine nontender CV: S1/S2 noted, no murmurs/rubs/gallops noted LUNGS: Lungs are clear to auscultation bilaterally, no apparent distress ABDOMEN: soft, nontender, no rebound or guarding, +BS GU:no cva tenderness NEURO: Pt is awake/alert, moves all extremitiesx4, has some weakness to right UE but there is no drift EXTREMITIES: pulses normal, full ROM SKIN: warm, color normal PSYCH: no abnormalities of mood noted  ED Course  Procedures  Labs Reviewed  BASIC METABOLIC PANEL  CBC  DIFFERENTIAL  URINALYSIS, ROUTINE W REFLEX MICROSCOPIC   Dg Chest Port 1 View  06/07/2011  *RADIOLOGY REPORT*  Clinical Data: Cough and shortness of breath.  Vomiting.  Coronary artery disease.  PORTABLE CHEST - 1 VIEW  Comparison: 05/29/2011  Findings: Mild chronic cardiomegaly.  Evidence of prior CABG. Pulmonary vascularity is normal and the lungs are clear.  No acute osseous abnormality.  IMPRESSION: No acute disease.  Original Report Authenticated By: Gwynn Burly, M.D.    11:49 PM Pt with episode of vomiting, now feels improved and he is nontoxic in appearance CXR shows no signs of aspiration  2:04 AM Pt improved, no distress, no cp/abd reported  He wants to go home  MDM    Nursing notes reviewed and considered in documentation xrays reviewed and considered All labs/vitals reviewed and considered Previous records reviewed and considered        Date: 06/08/2011  Rate: 66  Rhythm: normal sinus rhythm  QRS Axis: normal  Intervals: normal  ST/T Wave abnormalities: nonspecific ST changes  Conduction Disutrbances:none  Narrative Interpretation:   Old EKG Reviewed: unchanged    Joya Gaskins, MD 06/08/11 [phone]

## 2011-06-07 NOTE — ED Notes (Addendum)
Per EMS: pt awoke vomiting, son requested pt be evaluated at ED. Per son, pt had vomit in mouth that required son to turn and remove emesis from mouth. Pt A&O. VSS. CBG 167; #22 in right hand and received Zofran 4mg  by EMS. Past hx of CVA with right sided deficit. No new deficits.

## 2011-06-07 NOTE — ED Notes (Signed)
ZOX:[id number]<AV> Expected date:06/07/11<BR> Expected time:10:12 PM<BR> Means of arrival:Ambulance<BR> Comments:<BR> EMS 251 GC, 60 yom emesis? aspiration

## 2011-06-08 ENCOUNTER — Other Ambulatory Visit: Payer: Self-pay

## 2011-06-08 LAB — URINE MICROSCOPIC-ADD ON

## 2011-06-08 LAB — URINALYSIS, ROUTINE W REFLEX MICROSCOPIC
Glucose, UA: NEGATIVE mg/dL
Ketones, ur: 40 mg/dL — AB
Protein, ur: 100 mg/dL — AB

## 2011-06-08 LAB — BASIC METABOLIC PANEL
CO2: 25 mEq/L (ref 19–32)
Calcium: 9.6 mg/dL (ref 8.4–10.5)
Glucose, Bld: 149 mg/dL — ABNORMAL HIGH (ref 70–99)
Sodium: 131 mEq/L — ABNORMAL LOW (ref 135–145)

## 2011-06-08 MED ORDER — DOXYCYCLINE HYCLATE 100 MG PO CAPS: 100.0000 mg | ORAL_CAPSULE | Freq: Two times a day (BID) | ORAL | Status: AC

## 2011-06-08 MED ORDER — DOXYCYCLINE HYCLATE 100 MG PO TABS
100.0000 mg | ORAL_TABLET | Freq: Once | ORAL | Status: AC
  Administered 2011-06-08: 100 mg via ORAL
  Filled 2011-06-08: qty 1

## 2011-06-08 MED ORDER — SODIUM CHLORIDE 0.9 % IV BOLUS (SEPSIS)
1000.0000 mL | Freq: Once | INTRAVENOUS | Status: AC
  Administered 2011-06-08: 1000 mL via INTRAVENOUS

## 2011-06-08 NOTE — ED Notes (Signed)
Patient is alert and oriented x3.  He is being DC to home via [facility].   He has been given DC instructions with follow up care. An E-script for doxycycline was sent to his pharmacy. He is not showing any signs of distress upon DC

## 2011-06-09 ENCOUNTER — Encounter (HOSPITAL_COMMUNITY): Payer: Self-pay | Admitting: *Deleted

## 2011-06-09 ENCOUNTER — Emergency Department (HOSPITAL_COMMUNITY)
Admission: EM | Admit: 2011-06-09 | Discharge: 2011-06-10 | Disposition: A | Discharge status: Other Acute Inpatient Hospital | Payer: Medicare Other | Attending: Emergency Medicine | Admitting: Emergency Medicine

## 2011-06-09 DIAGNOSIS — Z8673 Personal history of transient ischemic attack (TIA), and cerebral infarction without residual deficits: Secondary | ICD-10-CM | POA: Insufficient documentation

## 2011-06-09 DIAGNOSIS — E119 Type 2 diabetes mellitus without complications: Secondary | ICD-10-CM | POA: Insufficient documentation

## 2011-06-09 DIAGNOSIS — R29898 Other symptoms and signs involving the musculoskeletal system: Secondary | ICD-10-CM | POA: Insufficient documentation

## 2011-06-09 DIAGNOSIS — R45851 Suicidal ideations: Secondary | ICD-10-CM | POA: Insufficient documentation

## 2011-06-09 DIAGNOSIS — I10 Essential (primary) hypertension: Secondary | ICD-10-CM | POA: Insufficient documentation

## 2011-06-09 DIAGNOSIS — F29 Unspecified psychosis not due to a substance or known physiological condition: Secondary | ICD-10-CM | POA: Insufficient documentation

## 2011-06-09 DIAGNOSIS — I25810 Atherosclerosis of coronary artery bypass graft(s) without angina pectoris: Secondary | ICD-10-CM | POA: Insufficient documentation

## 2011-06-09 DIAGNOSIS — Z79899 Other long term (current) drug therapy: Secondary | ICD-10-CM | POA: Insufficient documentation

## 2011-06-09 LAB — CBC
HCT: 51.9 % (ref 39.0–52.0)
Hemoglobin: 18.3 g/dL — ABNORMAL HIGH (ref 13.0–17.0)
MCV: 92.2 fL (ref 78.0–100.0)
RBC: 5.63 MIL/uL (ref 4.22–5.81)
WBC: 11 10*3/uL — ABNORMAL HIGH (ref 4.0–10.5)

## 2011-06-09 LAB — COMPREHENSIVE METABOLIC PANEL
Albumin: 4.4 g/dL (ref 3.5–5.2)
Alkaline Phosphatase: 84 U/L (ref 39–117)
BUN: 13 mg/dL (ref 6–23)
Chloride: 94 mEq/L — ABNORMAL LOW (ref 96–112)
GFR calc Af Amer: >90 mL/min (ref >90)
Glucose, Bld: 90 mg/dL (ref 70–99)
Potassium: 4.5 mEq/L (ref 3.5–5.1)
Total Bilirubin: 0.7 mg/dL (ref 0.3–1.2)

## 2011-06-09 LAB — URINALYSIS, ROUTINE W REFLEX MICROSCOPIC
Ketones, ur: NEGATIVE mg/dL
Leukocytes, UA: NEGATIVE
Nitrite: NEGATIVE
Protein, ur: 30 mg/dL — AB
pH: 5.5 (ref 5.0–8.0)

## 2011-06-09 LAB — DIFFERENTIAL
Lymphocytes Relative: 17 % (ref 12–46)
Lymphs Abs: 1.9 10*3/uL (ref 0.7–4.0)
Monocytes Relative: 9 % (ref 3–12)
Neutro Abs: 8.1 10*3/uL — ABNORMAL HIGH (ref 1.7–7.7)
Neutrophils Relative %: 73 % (ref 43–77)

## 2011-06-09 LAB — URINE CULTURE: Culture  Setup Time: 201301011646

## 2011-06-09 LAB — ETHANOL: Alcohol, Ethyl (B): <11 mg/dL (ref 0–11)

## 2011-06-09 LAB — URINE MICROSCOPIC-ADD ON

## 2011-06-09 MED ORDER — DOXYCYCLINE HYCLATE 100 MG PO CAPS
100.0000 mg | ORAL_CAPSULE | Freq: Two times a day (BID) | ORAL | Status: DC
Start: 2011-06-09 — End: 2011-06-09

## 2011-06-09 MED ORDER — METOPROLOL SUCCINATE ER 25 MG PO TB24
25.0000 mg | ORAL_TABLET | Freq: Every day | ORAL | Status: DC
  Administered 2011-06-09 – 2011-06-10 (×2): 25 mg via ORAL
  Filled 2011-06-09 (×3): qty 1

## 2011-06-09 MED ORDER — DOXYCYCLINE HYCLATE 100 MG PO TABS
100.0000 mg | ORAL_TABLET | Freq: Two times a day (BID) | ORAL | Status: DC
  Administered 2011-06-09 – 2011-06-10 (×2): 100 mg via ORAL
  Filled 2011-06-09 (×2): qty 1

## 2011-06-09 MED ORDER — ONDANSETRON HCL 4 MG PO TABS: 4.0000 mg | ORAL_TABLET | Freq: Three times a day (TID) | ORAL | Status: DC | PRN

## 2011-06-09 MED ORDER — ZOLPIDEM TARTRATE 5 MG PO TABS: 5.0000 mg | ORAL_TABLET | Freq: Every evening | ORAL | Status: DC | PRN

## 2011-06-09 MED ORDER — PAROXETINE HCL 30 MG PO TABS
30.0000 mg | ORAL_TABLET | Freq: Every day | ORAL | Status: DC
  Administered 2011-06-09 – 2011-06-10 (×2): 30 mg via ORAL
  Filled 2011-06-09 (×3): qty 1

## 2011-06-09 MED ORDER — ALPRAZOLAM 0.5 MG PO TABS
1.0000 mg | ORAL_TABLET | Freq: Three times a day (TID) | ORAL | Status: DC | PRN
  Filled 2011-06-09: qty 1

## 2011-06-09 MED ORDER — ACETAMINOPHEN 325 MG PO TABS: 650.0000 mg | ORAL_TABLET | ORAL | Status: DC | PRN

## 2011-06-09 MED ORDER — RAMIPRIL 5 MG PO CAPS
5.0000 mg | ORAL_CAPSULE | Freq: Every day | ORAL | Status: DC
  Administered 2011-06-09 – 2011-06-10 (×2): 5 mg via ORAL
  Filled 2011-06-09 (×3): qty 1

## 2011-06-09 MED ORDER — MELOXICAM 15 MG PO TABS
15.0000 mg | ORAL_TABLET | Freq: Every day | ORAL | Status: DC
  Administered 2011-06-09 – 2011-06-10 (×2): 15 mg via ORAL
  Filled 2011-06-09 (×4): qty 1

## 2011-06-09 MED ORDER — NICOTINE 21 MG/24HR TD PT24: 21.0000 mg | MEDICATED_PATCH | Freq: Every day | TRANSDERMAL | Status: DC | PRN

## 2011-06-09 MED ORDER — IBUPROFEN 200 MG PO TABS: 600.0000 mg | ORAL_TABLET | Freq: Three times a day (TID) | ORAL | Status: DC | PRN

## 2011-06-09 NOTE — ED Provider Notes (Signed)
History     CSN: [id number]  Arrival date & time 06/09/11  1407   First MD Initiated Contact with Patient 06/09/11 1709      Chief Complaint  Patient presents with  . V70.1    (Consider location/radiation/quality/duration/timing/severity/associated sxs/prior treatment) HPI Melvin Krueger is a 61 y.o. male presents with c/o hallucinations leading to desire to be assessed in the ED. The sx(s) have been present for less than 12 hours. Additional concerns are the patient may harm himself. Causative factors are none known. Palliative factors are none. The distress associated is moderate. The disorder has been present for less than 12 hours. The patient was evaluated early morning at [facility] Emergency department. Later, his son noted that the patient was hallucinating and felt like he would harm himself with a knife. The son decided to take out commitment papers on him. The patient was brought here by  [facility]  department officers.    Past Medical History  Diagnosis Date  . Coronary artery disease   . Hypertension   . Diabetes mellitus   . Stroke   . Anxiety   . Substance abuse   . Alcohol abuse     Past Surgical History  Procedure Date  . Cardiac surgery     CABG  . Carotid endarterectomy     left side    No family history on file.  History  Substance Use Topics  . Smoking status: Current Everyday Smoker  . Smokeless tobacco: Not on file  . Alcohol Use: Yes     occasionally      Review of Systems  All other systems reviewed and are negative.    Allergies  Penicillins  Home Medications   Current Outpatient Rx  Name Route Sig Dispense Refill  . ALPRAZOLAM 1 MG PO TABS Oral Take 1 mg by mouth every 4 (four) hours as needed. For anxiety.     . ASPIRIN-SALICYLAMIDE-CAFFEINE 409-811-91 MG PO PACK Oral Take 1 Package by mouth daily as needed. As needed for pain     . CIPROFLOXACIN HCL 500 MG PO TABS Oral Take 500 mg by mouth 2 (two) times daily. 7 day  course of therapy; completed.     Marland Kitchen DOXYCYCLINE HYCLATE 100 MG PO CAPS Oral Take 1 capsule (100 mg total) by mouth 2 (two) times daily. 20 capsule 0  . MELOXICAM 15 MG PO TABS Oral Take 15 mg by mouth daily.      Marland Kitchen METOPROLOL SUCCINATE ER 25 MG PO TB24 Oral Take 25 mg by mouth daily.      . CENTRUM SILVER ULTRA MENS PO Oral Take 1 tablet by mouth daily.      Marland Kitchen PAROXETINE HCL 30 MG PO TABS Oral Take 30 mg by mouth every morning.      Marland Kitchen RAMIPRIL 5 MG PO CAPS Oral Take 5 mg by mouth daily.        BP 158/65  Pulse 68  Temp(Src) 98 F (36.7 C) (Oral)  Resp 20  Wt 170 lb (77.111 kg)  SpO2 96%  Physical Exam  Nursing note and vitals reviewed. Constitutional: He is oriented to person, place, and time. He appears well-developed and well-nourished.       He has poor hygiene  HENT:  Head: Normocephalic and atraumatic.  Right Ear: External ear normal.  Left Ear: External ear normal.  Eyes: Conjunctivae and EOM are normal. Pupils are equal, round, and reactive to light.  Neck: Normal range of motion and phonation  normal. Neck supple.  Cardiovascular: Normal rate, regular rhythm, normal heart sounds and intact distal pulses.   Pulmonary/Chest: Effort normal and breath sounds normal. He exhibits no bony tenderness.  Abdominal: Soft. Normal appearance. There is no tenderness.  Musculoskeletal: Normal range of motion.  Neurological: He is alert and oriented to person, place, and time. He has normal strength. No cranial nerve deficit or sensory deficit.       He has a weak and clumsy right leg and right arm. He is able to walk with assistance, holding both of his hands. He feels he is at his baseline. He states that he uses a walker at home or an electric wheelchair to get around.  Skin: Skin is warm, dry and intact.  Psychiatric: His behavior is normal.       He is not overtly depressed    ED Course  Procedures (including critical care time)  Labs Reviewed  URINALYSIS, ROUTINE W REFLEX  MICROSCOPIC - Abnormal; Notable for the following:    Hgb urine dipstick TRACE (*)    Bilirubin Urine SMALL (*)    Protein, ur 30 (*)    All other components within normal limits  URINE RAPID DRUG SCREEN (HOSP PERFORMED) - Abnormal; Notable for the following:    Benzodiazepines POSITIVE (*)    All other components within normal limits  CBC - Abnormal; Notable for the following:    WBC 11.0 (*)    Hemoglobin 18.3 (*)    All other components within normal limits  DIFFERENTIAL - Abnormal; Notable for the following:    Neutro Abs 8.1 (*)    All other components within normal limits  COMPREHENSIVE METABOLIC PANEL - Abnormal; Notable for the following:    Sodium 134 (*)    Chloride 94 (*)    All other components within normal limits  URINE MICROSCOPIC-ADD ON - Abnormal; Notable for the following:    Squamous Epithelial / LPF FEW (*)    Bacteria, UA FEW (*)    All other components within normal limits  ETHANOL   Dg Chest Port 1 View  06/07/2011  *RADIOLOGY REPORT*  Clinical Data: Cough and shortness of breath.  Vomiting.  Coronary artery disease.  PORTABLE CHEST - 1 VIEW  Comparison: 05/29/2011  Findings: Mild chronic cardiomegaly.  Evidence of prior CABG. Pulmonary vascularity is normal and the lungs are clear.  No acute osseous abnormality.  IMPRESSION: No acute disease.  Original Report Authenticated By: Gwynn Burly, M.D.   01:48- patient was seen by psychiatrist via telemetry- he rescinded. The IVC and referred patient to outpatient treatment.   No diagnosis found.    MDM  Hallucinations, with suicidal ideation        Melvin Melter, MD 06/10/11 1257

## 2011-06-09 NOTE — ED Notes (Signed)
Pt here with [employer], IVC papers in place, pt stating "my son is getting my SS check and that's the only reason I'm thinking I'm here"; pt states is February 3, the year is February

## 2011-06-09 NOTE — ED Notes (Signed)
Spoke with MD from telepsych. Monitor setup in pt's room awaiting  md's call.

## 2011-06-09 NOTE — ED Notes (Signed)
Pt on telepscyh conference with MD.

## 2011-06-09 NOTE — ED Notes (Signed)
Telepsych info sent over. Called telepsych center awaiting MD call back.

## 2011-06-09 NOTE — ED Notes (Signed)
Patient is resting comfortably. 

## 2011-06-10 ENCOUNTER — Emergency Department (HOSPITAL_COMMUNITY): Payer: Medicare Other

## 2011-06-10 ENCOUNTER — Other Ambulatory Visit: Payer: Self-pay

## 2011-06-10 LAB — BASIC METABOLIC PANEL
Calcium: 9.3 mg/dL (ref 8.4–10.5)
Creatinine, Ser: 0.71 mg/dL (ref 0.50–1.35)
GFR calc non Af Amer: >90 mL/min (ref >90)
Glucose, Bld: 126 mg/dL — ABNORMAL HIGH (ref 70–99)
Sodium: 132 mEq/L — ABNORMAL LOW (ref 135–145)

## 2011-06-10 LAB — CBC
MCH: 32.4 pg (ref 26.0–34.0)
MCHC: 35.1 g/dL (ref 30.0–36.0)
Platelets: 243 10*3/uL (ref 150–400)
RBC: 5.37 MIL/uL (ref 4.22–5.81)
RDW: 12.4 % (ref 11.5–15.5)

## 2011-06-10 LAB — URINE CULTURE
Colony Count: 4000
Culture  Setup Time: 201301030156

## 2011-06-10 NOTE — ED Notes (Signed)
telepsych being done at this time.  

## 2011-06-10 NOTE — ED Notes (Signed)
Psych md states pt ready to be d/c'd. Pt does not have anyone at home to let him in the house. Called pt's daughter and she stated she does not have car to come pick up pt and no one is at pt's home. ED md and charge nurse informed. Pt to sleep in ED unit am.

## 2011-06-10 NOTE — ED Provider Notes (Signed)
The patient follows up for discharge and had a previous telemedicine psychiatry consult same the patient could be sent home. At that time patient was alert and oriented and acting appropriately. Patient was offered discharge, but now is disoriented and acting inappropriately. We'll cancel the discharge. And patient will need to have formal psychiatric assessment.     Melvin Jakes, MD 06/10/11 [phone]

## 2011-06-10 NOTE — ED Notes (Signed)
[facility] called from transportation 

## 2011-06-10 NOTE — BH Assessment (Addendum)
Assessment Note   Melvin Krueger is an 61 y.o. male.  Patient was bought into [facility] by family with IVC papers due to [facility] and [facility]. Patient was seen by telepsych last night and cleared for discharge as he was not displaying behaviors, denied hallucinations and was alert ox3. This morning, he was seen by staff responding to internal stimuli, and hallucinations and disoriented. Patient reported it to be 1999 and states he spent the night at home last night, when in fact he was here at hospital. Patient's family states patient has been seeing 4 ladies in his bedroom and a dog named "Peanut."  Patient has been medically cleared and pending inpatient psych placement.  Axis I: 708 Delirium NOS Axis II: Deferred Axis III:  Past Medical History  Diagnosis Date  . Coronary artery disease   . Hypertension   . Diabetes mellitus   . Stroke   . Anxiety   . Substance abuse   . Alcohol abuse    Axis IV: other psychosocial or environmental problems Axis V: 31-40 impairment in reality testing  Past Medical History:  Past Medical History  Diagnosis Date  . Coronary artery disease   . Hypertension   . Diabetes mellitus   . Stroke   . Anxiety   . Substance abuse   . Alcohol abuse     Past Surgical History  Procedure Date  . Cardiac surgery     CABG  . Carotid endarterectomy     left side    Family History: No family history on file.  Social History:  reports that he has been smoking.  He does not have any smokeless tobacco history on file. He reports that he drinks alcohol. He reports that he does not use illicit drugs.  Additional Social History:    Allergies:  Allergies  Allergen Reactions  . Penicillins Rash    Home Medications:  Medications Prior to Admission  Medication Dose Route Frequency Provider Last Rate Last Dose  . acetaminophen (TYLENOL) tablet 650 mg  650 mg Oral Q4H PRN [name], MD      . ALPRAZolam Prudy Feeler) tablet 1 mg  1 mg Oral TID PRN [name], MD       . doxycycline (VIBRA-TABS) tablet 100 mg  100 mg Oral Once Joya Gaskins, MD   100 mg at 06/08/11 0329  . doxycycline (VIBRA-TABS) tablet 100 mg  100 mg Oral Q12H Hardin Negus [place], PHARMD   100 mg at 06/09/11 2239  . ibuprofen (ADVIL,MOTRIN) tablet 600 mg  600 mg Oral Q8H PRN [name], MD      . meloxicam [facility]) tablet 15 mg  15 mg Oral Daily [name], MD   15 mg at 06/09/11 2301  . metoprolol succinate (TOPROL-XL) 24 hr tablet 25 mg  25 mg Oral Daily [name], MD   25 mg at 06/09/11 2240  . nicotine (NICODERM CQ - dosed in mg/24 hours) patch 21 mg  21 mg Transdermal Daily PRN [name], MD      . ondansetron [facility]) injection 4 mg  4 mg Intravenous Once Joya Gaskins, MD   4 mg at 06/08/11 0040  . ondansetron (ZOFRAN) tablet 4 mg  4 mg Oral Q8H PRN [name], MD      . PARoxetine (PAXIL) tablet 30 mg  30 mg Oral Daily [name], MD   30 mg at 06/09/11 2240  . ramipril (ALTACE) capsule 5 mg  5 mg Oral Daily [name], MD   5 mg at 06/09/11 2239  . sodium chloride 0.9 % bolus 1,000 mL  1,000 mL Intravenous Once Joya Gaskins, MD   1,000 mL at 06/08/11 0207  . sodium chloride 0.9 % bolus 500 mL  500 mL Intravenous Once Joya Gaskins, MD   500 mL at 06/08/11 0041  . zolpidem (AMBIEN) tablet 5 mg  5 mg Oral QHS PRN [name], MD      . DISCONTD: doxycycline (VIBRAMYCIN) capsule 100 mg  100 mg Oral BID [name], MD       Medications Prior to Admission  Medication Sig Dispense Refill  . Aspirin-Salicylamide-Caffeine (BC FAST PAIN RELIEF ARTHRITIS) 161-096-04 MG PACK Take 1 Package by mouth daily as needed. As needed for pain       . doxycycline (VIBRAMYCIN) 100 MG capsule Take 1 capsule (100 mg total) by mouth 2 (two) times daily.  20 capsule  0  . meloxicam (MOBIC) 15 MG tablet Take 15 mg by mouth daily.        Marland Kitchen PARoxetine (PAXIL) 30 MG tablet Take 30 mg by mouth every morning.        . ramipril (ALTACE) 5 MG capsule  Take 5 mg by mouth daily.          OB/GYN Status:  No LMP for male patient.        Risk to self Substance abuse history and/or treatment for substance abuse?: No        Mental Status Report Motor Activity: Unremarkable                                    Disposition: Patient accepted to [facility] Geriatric psych unit by Dr. Lowanda Foster. Patient to be transported by [facility]. Patient's daughter contacted and advised of disposition. Patient's nurse provided with phone number to call report.     On Site Evaluation by:  Saul Fordyce Reviewed with Physician:  Chip Boer 06/10/2011 10:08 AM

## 2011-06-10 NOTE — Progress Notes (Addendum)
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  CSW approached patient to speak with him who displayed flight of ideas. Patient not alert and oriented. Complete psych to be completed. Discussed cancel of d/c with EDP who agreed.  Ileene Hutchinson , MSW, LCSWA 06/10/2011 8:45 AM [phone]  Telepsych has been completed on patient. Pending SOC recommendations.  Ileene Hutchinson , MSW, LCSWA 06/10/2011 9:44 AM [phone]

## 2011-06-10 NOTE — Discharge Planning (Signed)
Patient's information will be sent to [facility] for possible disposition once EKG and Head CT have been completed and results have been entered.  Patient's nurse notified.  Ileene Hutchinson , MSW, LCSWA 06/10/2011 11:02 AM [phone]

## 2011-06-10 NOTE — Discharge Planning (Signed)
Patient has been accepted to [facility] by Dr. Lowanda Foster. Patient to be transported by [facility]. Patient's daughter contacted and advised of disposition. Daughter also given phone number and address to [facility].  Due to patient's delirium state, [place] was given this patient's daughter's phone number for collateral information.  Patient's nurse and EDP notified of disposition.  Ileene Hutchinson , MSW, LCSWA 06/10/2011  2:04 PM [phone]

## 2011-06-10 NOTE — ED Notes (Signed)
Pt is confused calling his dog" Peanut" EDPD aware d/c on hold

## 2011-06-10 NOTE — ED Notes (Signed)
Patient transported to CT 

## 2011-07-15 ENCOUNTER — Other Ambulatory Visit: Payer: Self-pay | Admitting: Family Medicine

## 2011-07-15 MED ORDER — PAROXETINE HCL 30 MG PO TABS: 30.0000 mg | ORAL_TABLET | ORAL | Status: DC

## 2011-07-15 MED ORDER — METOPROLOL SUCCINATE ER 50 MG PO TB24: 50.0000 mg | ORAL_TABLET | Freq: Every day | ORAL | Status: DC

## 2011-07-15 MED ORDER — ALPRAZOLAM 1 MG PO TABS: ORAL_TABLET | ORAL | Status: DC

## 2011-07-15 MED ORDER — RAMIPRIL 5 MG PO CAPS: 5.0000 mg | ORAL_CAPSULE | Freq: Every day | ORAL | Status: DC

## 2011-07-15 MED ORDER — LAMOTRIGINE 200 MG PO TABS: 200.0000 mg | ORAL_TABLET | Freq: Every day | ORAL | Status: DC

## 2011-07-24 ENCOUNTER — Ambulatory Visit (INDEPENDENT_AMBULATORY_CARE_PROVIDER_SITE_OTHER): Payer: Medicare Other | Admitting: Family Medicine

## 2011-07-24 VITALS — BP 180/81 | HR 67 | Temp 99.2°F | Resp 18 | Wt 173.0 lb

## 2011-07-24 DIAGNOSIS — F329 Major depressive disorder, single episode, unspecified: Secondary | ICD-10-CM

## 2011-07-24 DIAGNOSIS — F32A Depression, unspecified: Secondary | ICD-10-CM | POA: Insufficient documentation

## 2011-07-24 DIAGNOSIS — IMO0001 Reserved for inherently not codable concepts without codable children: Secondary | ICD-10-CM

## 2011-07-24 DIAGNOSIS — I10 Essential (primary) hypertension: Secondary | ICD-10-CM | POA: Insufficient documentation

## 2011-07-24 DIAGNOSIS — M1909 Primary osteoarthritis, other specified site: Secondary | ICD-10-CM

## 2011-07-24 DIAGNOSIS — I6789 Other cerebrovascular disease: Secondary | ICD-10-CM

## 2011-07-24 DIAGNOSIS — I639 Cerebral infarction, unspecified: Secondary | ICD-10-CM | POA: Insufficient documentation

## 2011-07-24 DIAGNOSIS — J449 Chronic obstructive pulmonary disease, unspecified: Secondary | ICD-10-CM

## 2011-07-24 DIAGNOSIS — F1020 Alcohol dependence, uncomplicated: Secondary | ICD-10-CM

## 2011-07-24 LAB — GLUCOSE, POCT (MANUAL RESULT ENTRY): POC Glucose: 110

## 2011-07-24 LAB — POCT GLYCOSYLATED HEMOGLOBIN (HGB A1C): Hemoglobin A1C: 5.9

## 2011-07-24 MED ORDER — METHYLPREDNISOLONE ACETATE 80 MG/ML IJ SUSP
80.0000 mg | Freq: Once | INTRAMUSCULAR | Status: AC
  Administered 2011-07-24: 80 mg via INTRAMUSCULAR

## 2011-07-24 MED ORDER — LAMOTRIGINE 200 MG PO TABS: 200.0000 mg | ORAL_TABLET | Freq: Two times a day (BID) | ORAL | Status: DC

## 2011-07-24 NOTE — Progress Notes (Signed)
61 yo alcoholic man who now lives with son Loraine Leriche.  He has suffered a CVA and now is confined to home in wheelchair.  He has a dense right hemiparesis and has been falling frequently. He has tried a walker but cannot grip the hand support on the right and also tires in 10 feet because his legs are weak.  He cannot propel a manual wheelchair because of the right sided weakness.  The joystick needs to be able to swivel because when he leans over, he can hit it if it is rigid, and the chair will lose control.  We have arranged for a home power chair last December which was approved but it needs repairs because the seat is now canted giving the patient back pain.  The battery is not lasting.  One tire is flat.   Repairs were promised but were never forthcoming.  Billey Gosling has been drinking lately and we talked about this.  He is seen with son and daughter who are advocating for him.  He complains of right chest pain.  Some urgency re: bowels with some incontinence.  O:  BP 135/85.   Alert, plethoric Right sided weakness and apraxia continue Chest:  Bilateral wheezes Mental status:    A:  Gradual deterioration. I've asked him to stop the alcohol, and we need to check the diabetes situation. COPD and arthritis have worsened, so another depo shot seems reasonable.  P:  DepoMedrol 80 IM Try to get better power chair. Advocate for stopping alcohol.

## 2011-08-02 ENCOUNTER — Encounter (HOSPITAL_COMMUNITY): Payer: Self-pay | Admitting: *Deleted

## 2011-08-02 ENCOUNTER — Emergency Department (HOSPITAL_COMMUNITY)
Admission: EM | Admit: 2011-08-02 | Discharge: 2011-08-02 | Disposition: A | Discharge status: Home / Self Care | Payer: Medicare Other | Attending: Emergency Medicine | Admitting: Emergency Medicine

## 2011-08-02 DIAGNOSIS — I10 Essential (primary) hypertension: Secondary | ICD-10-CM | POA: Insufficient documentation

## 2011-08-02 DIAGNOSIS — E119 Type 2 diabetes mellitus without complications: Secondary | ICD-10-CM | POA: Insufficient documentation

## 2011-08-02 DIAGNOSIS — F32A Depression, unspecified: Secondary | ICD-10-CM

## 2011-08-02 DIAGNOSIS — F329 Major depressive disorder, single episode, unspecified: Secondary | ICD-10-CM

## 2011-08-02 DIAGNOSIS — Z8673 Personal history of transient ischemic attack (TIA), and cerebral infarction without residual deficits: Secondary | ICD-10-CM | POA: Insufficient documentation

## 2011-08-02 DIAGNOSIS — F3289 Other specified depressive episodes: Secondary | ICD-10-CM | POA: Insufficient documentation

## 2011-08-02 DIAGNOSIS — I25810 Atherosclerosis of coronary artery bypass graft(s) without angina pectoris: Secondary | ICD-10-CM | POA: Insufficient documentation

## 2011-08-02 LAB — COMPREHENSIVE METABOLIC PANEL WITH GFR
Alkaline Phosphatase: 121 U/L — ABNORMAL HIGH (ref 39–117)
BUN: 23 mg/dL (ref 6–23)
CO2: 28 meq/L (ref 19–32)
Chloride: 98 meq/L (ref 96–112)
GFR calc Af Amer: >90 mL/min (ref >90)
GFR calc non Af Amer: >90 mL/min (ref >90)
Glucose, Bld: 168 mg/dL — ABNORMAL HIGH (ref 70–99)
Potassium: 3.9 meq/L (ref 3.5–5.1)
Total Bilirubin: 0.3 mg/dL (ref 0.3–1.2)

## 2011-08-02 LAB — RAPID URINE DRUG SCREEN, HOSP PERFORMED
Amphetamines: NOT DETECTED
Barbiturates: NOT DETECTED
Benzodiazepines: POSITIVE — AB
Cocaine: NOT DETECTED
Opiates: NOT DETECTED
Tetrahydrocannabinol: NOT DETECTED

## 2011-08-02 LAB — CBC
HCT: 51 % (ref 39.0–52.0)
Hemoglobin: 17.9 g/dL — ABNORMAL HIGH (ref 13.0–17.0)
MCH: 33.3 pg (ref 26.0–34.0)
MCHC: 35.1 g/dL (ref 30.0–36.0)
MCV: 94.8 fL (ref 78.0–100.0)
Platelets: 222 10*3/uL (ref 150–400)
RBC: 5.38 MIL/uL (ref 4.22–5.81)
RDW: 12.8 % (ref 11.5–15.5)
WBC: 10 K/uL (ref 4.0–10.5)

## 2011-08-02 LAB — COMPREHENSIVE METABOLIC PANEL
ALT: 9 U/L (ref 0–53)
AST: 11 U/L (ref 0–37)
Albumin: 4.3 g/dL (ref 3.5–5.2)
Calcium: 9.3 mg/dL (ref 8.4–10.5)
Creatinine, Ser: 0.83 mg/dL (ref 0.50–1.35)
Sodium: 135 mEq/L (ref 135–145)
Total Protein: 7.6 g/dL (ref 6.0–8.3)

## 2011-08-02 LAB — ACETAMINOPHEN LEVEL: Acetaminophen (Tylenol), Serum: <15 ug/mL (ref 10–30)

## 2011-08-02 LAB — ETHANOL: Alcohol, Ethyl (B): <11 mg/dL (ref 0–11)
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
## 2011-08-02 NOTE — Discharge Instructions (Signed)
Depression You have signs of depression. This is a common problem. It can occur at any age. It is often hard to recognize. People can suffer from depression and still have moments of enjoyment. Depression interferes with your basic ability to function in life. It upsets your relationships, sleep, eating, and work habits. CAUSES  Depression is believed to be caused by an imbalance in brain chemicals. It may be triggered by an unpleasant event. Relationship crises, a death in the family, financial worries, retirement, or other stressors are normal causes of depression. Depression may also start for no known reason. Other factors that may play a part include medical illnesses, some medicines, genetics, and alcohol or drug abuse. SYMPTOMS   Feeling unhappy or worthless.   Long-lasting (chronic) tiredness or worn-out feeling.   Self-destructive thoughts and actions.   Not being able to sleep or sleeping too much.   Eating more than usual or not eating at all.   Headaches or feeling anxious.   Trouble concentrating or making decisions.   Unexplained physical problems and substance abuse.  TREATMENT  Depression usually gets better with treatment. This can include:  Antidepressant medicines. It can take weeks before the proper dose is achieved and benefits are reached.   Talking with a therapist, clergyperson, counselor, or friend. These people can help you gain insight into your problem and regain control of your life.   Eating a good diet.   Getting regular physical exercise, such as walking for 30 minutes every day.   Not abusing alcohol or drugs.  Treating depression often takes 6 months or longer. This length of treatment is needed to keep symptoms from returning. Call your caregiver and arrange for follow-up care as suggested. SEEK IMMEDIATE MEDICAL CARE IF:   You start to have thoughts of hurting yourself or others.   Call your local emergency services (911 in [place]   Go to  your local medical emergency department.   Call the National Suicide Prevention Lifeline: 1-800-273-TALK (1-800-273-8255).  Document Released: 05/24/2005 Document Revised: 02/03/2011 Document Reviewed: 10/24/2009 [employer] Patient Information 2012 [employer]. 

## 2011-08-02 NOTE — ED Provider Notes (Signed)
Medical screening examination/treatment/procedure(s) were performed by non-physician practitioner and as supervising physician I was immediately available for consultation/collaboration.  Tele-psych consultation performed, pt has depression, but apparently no sig SI.  Pt is cleared from psych standpoint for discharge and IVC was rescinded.    Selena Swaminathan Y.   Gavin Pound. Oletta Lamas, MD 08/02/11 1610
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
## 2011-08-02 NOTE — ED Notes (Signed)
WUJ:[id number]<JY> Expected date:<BR> Expected time:<BR> Means of arrival:<BR> Comments:<BR> For [name]

## 2011-08-04 NOTE — ED Provider Notes (Signed)
Please see my prior accompanying note  Melvin Krueger. Oletta Lamas, MD 08/04/11 [phone]

## 2011-08-13 ENCOUNTER — Other Ambulatory Visit: Payer: Self-pay | Admitting: Family Medicine

## 2011-08-16 ENCOUNTER — Other Ambulatory Visit: Payer: Self-pay | Admitting: Family Medicine

## 2011-08-16 MED ORDER — RAMIPRIL 5 MG PO CAPS: 5.0000 mg | ORAL_CAPSULE | Freq: Every day | ORAL | Status: DC

## 2011-09-03 ENCOUNTER — Other Ambulatory Visit: Payer: Self-pay | Admitting: Family Medicine

## 2011-09-03 NOTE — Telephone Encounter (Signed)
Spoke with Melvin Krueger, who states that they have recently switched her father from [facility] to [facility] on [address].  They dropped off patients recent scripts to [facility], but were told that [facility] didn't have any refills for patient.  I called [facility]- [address], who states that the refills are as follows:  Lamictal - rx rf ready to pickup, has 10 refills (30 day supply) Metoprolol - pt received 90 day rx on 3/1, has 2 refills Altace - pt received 90 day on 3/1, has 3 rfs Paxil - pt received 90 day on 3/1, has 3 rfs  Xanax - has rx for #90 2 rfs, but patient is taking qid now...  *Apparently (?) last Xanax rx from February is not at pharmacy.  Should we d/c old rx and call in Xanax rx entered 2/7 for #120 5 refills?  To MD to advise.  Chart in MD box.  Please call Melvin Krueger with response.

## 2011-09-03 NOTE — Telephone Encounter (Signed)
Dr. Elbert Ewings, I saw that the altace was refilled,  But did you speak with Lupita Leash about the Xanax?

## 2011-09-24 ENCOUNTER — Telehealth: Payer: Self-pay

## 2011-09-24 NOTE — Telephone Encounter (Signed)
Patient would like to know if you can call in a rx for cipro for bladder infection?

## 2011-09-24 NOTE — Telephone Encounter (Signed)
Pt son Melvin Krueger would like for Dr. Milus Glazier to contact him concerning his father. He didn't say the reason why.

## 2011-09-25 ENCOUNTER — Other Ambulatory Visit: Payer: Self-pay | Admitting: Family Medicine

## 2011-09-25 MED ORDER — CIPROFLOXACIN HCL 500 MG PO TABS: 500.0000 mg | ORAL_TABLET | Freq: Two times a day (BID) | ORAL | Status: AC

## 2011-09-26 NOTE — Telephone Encounter (Signed)
Pt called into the answering service and states he dropped his rx in a bucket of urine, he would like for someone to contact him.

## 2011-09-27 ENCOUNTER — Telehealth: Payer: Self-pay | Admitting: *Deleted

## 2011-09-27 NOTE — Telephone Encounter (Signed)
Pt needs rx refill for xanax. Pt has been out for about 4 days.

## 2011-09-28 ENCOUNTER — Other Ambulatory Visit: Payer: Self-pay | Admitting: Family Medicine

## 2011-09-28 NOTE — Telephone Encounter (Signed)
Please refill Xanax for Mr. Ether Griffins

## 2011-10-18 ENCOUNTER — Ambulatory Visit (INDEPENDENT_AMBULATORY_CARE_PROVIDER_SITE_OTHER): Payer: Medicare Other | Admitting: Family Medicine

## 2011-10-18 VITALS — BP 143/81 | HR 56 | Temp 97.7°F | Resp 16

## 2011-10-18 DIAGNOSIS — R109 Unspecified abdominal pain: Secondary | ICD-10-CM | POA: Insufficient documentation

## 2011-10-18 DIAGNOSIS — R739 Hyperglycemia, unspecified: Secondary | ICD-10-CM

## 2011-10-18 DIAGNOSIS — I10 Essential (primary) hypertension: Secondary | ICD-10-CM

## 2011-10-18 DIAGNOSIS — F419 Anxiety disorder, unspecified: Secondary | ICD-10-CM

## 2011-10-18 DIAGNOSIS — F1020 Alcohol dependence, uncomplicated: Secondary | ICD-10-CM

## 2011-10-18 DIAGNOSIS — I639 Cerebral infarction, unspecified: Secondary | ICD-10-CM

## 2011-10-18 DIAGNOSIS — R7309 Other abnormal glucose: Secondary | ICD-10-CM

## 2011-10-18 DIAGNOSIS — F411 Generalized anxiety disorder: Secondary | ICD-10-CM

## 2011-10-18 LAB — POCT CBC
Granulocyte percent: 71.3 %G (ref 37–80)
HCT, POC: 52.9 % (ref 43.5–53.7)
Hemoglobin: 17.6 g/dL (ref 14.1–18.1)
Lymph, poc: 2.1 (ref 0.6–3.4)
MCH, POC: 32.4 pg — AB (ref 27–31.2)
MCHC: 33.3 g/dL (ref 31.8–35.4)
MCV: 97.4 fL — AB (ref 80–97)
MID (cbc): 0.6 (ref 0–0.9)
MPV: 8.3 fL (ref 0–99.8)
POC Granulocyte: 6.7 (ref 2–6.9)
POC LYMPH PERCENT: 22.4 %L (ref 10–50)
POC MID %: 6.3 %M (ref 0–12)
Platelet Count, POC: 228 10*3/uL (ref 142–424)
RBC: 5.43 M/uL (ref 4.69–6.13)
RDW, POC: 13.9 %
WBC: 9.4 10*3/uL (ref 4.6–10.2)

## 2011-10-18 LAB — GLUCOSE, POCT (MANUAL RESULT ENTRY): POC Glucose: 63

## 2011-10-18 LAB — POCT GLYCOSYLATED HEMOGLOBIN (HGB A1C): Hemoglobin A1C: 5.5

## 2011-10-18 MED ORDER — ALPRAZOLAM 1 MG PO TABS: ORAL_TABLET | ORAL | Status: DC

## 2011-10-18 MED ORDER — CELECOXIB 200 MG PO CAPS: 200.0000 mg | ORAL_CAPSULE | Freq: Two times a day (BID) | ORAL | Status: DC

## 2011-10-18 MED ORDER — RAMIPRIL 5 MG PO CAPS: 5.0000 mg | ORAL_CAPSULE | Freq: Every day | ORAL | Status: DC

## 2011-10-18 MED ORDER — LAMOTRIGINE 200 MG PO TABS: 200.0000 mg | ORAL_TABLET | Freq: Two times a day (BID) | ORAL | Status: DC

## 2011-10-18 MED ORDER — METOPROLOL SUCCINATE ER 50 MG PO TB24: 50.0000 mg | ORAL_TABLET | Freq: Every day | ORAL | Status: DC

## 2011-10-18 MED ORDER — PAROXETINE HCL 40 MG PO TABS: 40.0000 mg | ORAL_TABLET | ORAL | Status: DC
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
## 2011-10-19 LAB — COMPREHENSIVE METABOLIC PANEL
ALT: 11 U/L (ref 0–53)
AST: 12 U/L (ref 0–37)
Albumin: 4.3 g/dL (ref 3.5–5.2)
Alkaline Phosphatase: 144 U/L — ABNORMAL HIGH (ref 39–117)
BUN: 11 mg/dL (ref 6–23)
CO2: 34 mEq/L — ABNORMAL HIGH (ref 19–32)
Calcium: 9.5 mg/dL (ref 8.4–10.5)
Chloride: 97 mEq/L (ref 96–112)
Creat: 0.74 mg/dL (ref 0.50–1.35)
Glucose, Bld: 80 mg/dL (ref 70–99)
Potassium: 5.2 mEq/L (ref 3.5–5.3)
Sodium: 140 mEq/L (ref 135–145)
Total Bilirubin: 0.4 mg/dL (ref 0.3–1.2)
Total Protein: 7.1 g/dL (ref 6.0–8.3)

## 2011-10-19 LAB — LIPID PANEL
Cholesterol: 210 mg/dL — ABNORMAL HIGH (ref 0–200)
HDL: 42 mg/dL (ref >39)
LDL Cholesterol: 104 mg/dL — ABNORMAL HIGH (ref 0–99)
Total CHOL/HDL Ratio: 5 Ratio
Triglycerides: 322 mg/dL — ABNORMAL HIGH (ref <150)
VLDL: 64 mg/dL — ABNORMAL HIGH (ref 0–40)

## 2011-10-22 ENCOUNTER — Other Ambulatory Visit: Payer: Self-pay | Admitting: Family Medicine

## 2011-10-22 DIAGNOSIS — N390 Urinary tract infection, site not specified: Secondary | ICD-10-CM

## 2011-10-22 MED ORDER — CIPROFLOXACIN HCL 500 MG PO TABS: 500.0000 mg | ORAL_TABLET | Freq: Two times a day (BID) | ORAL | Status: AC

## 2011-10-23 ENCOUNTER — Other Ambulatory Visit: Payer: Self-pay | Admitting: Family Medicine

## 2011-10-23 DIAGNOSIS — F22 Delusional disorders: Secondary | ICD-10-CM

## 2011-10-23 MED ORDER — HALOPERIDOL 2 MG PO TABS: 2.0000 mg | ORAL_TABLET | Freq: Two times a day (BID) | ORAL | Status: DC

## 2011-10-31 ENCOUNTER — Emergency Department (HOSPITAL_COMMUNITY): Payer: Medicare Other

## 2011-10-31 ENCOUNTER — Emergency Department (HOSPITAL_COMMUNITY)
Admission: EM | Admit: 2011-10-31 | Discharge: 2011-10-31 | Disposition: A | Discharge status: Home / Self Care | Payer: Medicare Other | Attending: Emergency Medicine | Admitting: Emergency Medicine

## 2011-10-31 ENCOUNTER — Encounter (HOSPITAL_COMMUNITY): Payer: Self-pay | Admitting: Emergency Medicine

## 2011-10-31 DIAGNOSIS — E119 Type 2 diabetes mellitus without complications: Secondary | ICD-10-CM | POA: Insufficient documentation

## 2011-10-31 DIAGNOSIS — R4182 Altered mental status, unspecified: Secondary | ICD-10-CM | POA: Insufficient documentation

## 2011-10-31 DIAGNOSIS — M7989 Other specified soft tissue disorders: Secondary | ICD-10-CM | POA: Insufficient documentation

## 2011-10-31 DIAGNOSIS — I2510 Atherosclerotic heart disease of native coronary artery without angina pectoris: Secondary | ICD-10-CM | POA: Insufficient documentation

## 2011-10-31 DIAGNOSIS — R209 Unspecified disturbances of skin sensation: Secondary | ICD-10-CM | POA: Insufficient documentation

## 2011-10-31 DIAGNOSIS — R319 Hematuria, unspecified: Secondary | ICD-10-CM

## 2011-10-31 DIAGNOSIS — I10 Essential (primary) hypertension: Secondary | ICD-10-CM | POA: Insufficient documentation

## 2011-10-31 LAB — URINALYSIS, ROUTINE W REFLEX MICROSCOPIC
Glucose, UA: NEGATIVE mg/dL
Nitrite: NEGATIVE
Protein, ur: 100 mg/dL — AB
pH: 5.5 (ref 5.0–8.0)

## 2011-10-31 LAB — URINE MICROSCOPIC-ADD ON

## 2011-10-31 LAB — CBC
Platelets: 188 10*3/uL (ref 150–400)
RDW: 12.5 % (ref 11.5–15.5)
WBC: 10.3 10*3/uL (ref 4.0–10.5)

## 2011-10-31 LAB — BASIC METABOLIC PANEL
CO2: 30 mEq/L (ref 19–32)
Chloride: 93 mEq/L — ABNORMAL LOW (ref 96–112)
GFR calc Af Amer: >90 mL/min (ref >90)
Sodium: 133 mEq/L — ABNORMAL LOW (ref 135–145)

## 2011-10-31 LAB — DIFFERENTIAL
Basophils Absolute: 0 10*3/uL (ref 0.0–0.1)
Lymphocytes Relative: 12 % (ref 12–46)
Neutro Abs: 8.3 10*3/uL — ABNORMAL HIGH (ref 1.7–7.7)
Neutrophils Relative %: 80 % — ABNORMAL HIGH (ref 43–77)

## 2011-10-31 LAB — ETHANOL: Alcohol, Ethyl (B): <11 mg/dL (ref 0–11)

## 2011-10-31 MED ORDER — SODIUM CHLORIDE 0.9 % IV BOLUS (SEPSIS)
500.0000 mL | Freq: Once | INTRAVENOUS | Status: AC
  Administered 2011-10-31: 500 mL via INTRAVENOUS

## 2011-10-31 NOTE — ED Notes (Signed)
Received pt from home with c/o woke up this morning told family that he felt tingling on right side of head and face. Family reports pt had change in LOC. Pt AAOx4 for EMS, with normal speech and equal grips. CBG for EMS 176. Family reports that pt is a daily drinker and smokes cigarettes daily. Pt last ETOH consumption 3 days ago, last cigarette 2 days ago.

## 2011-10-31 NOTE — Discharge Instructions (Signed)
Melvin Krueger the CT of your head shows no stroke.  Take an aspirin every day.  The labs were unremarkable.  Follow up with your pcp or the urologist listed below.  You have blood in your urine today without infection.  Make sure this clears up.  Follow up this week.   Hematuria, Adult Hematuria (blood in your urine) can be caused by a bladder infection (cystitis), kidney infection (pyelonephritis), prostate infection (prostatitis), or kidney stone. Infections will usually respond to antibiotics (medications which kill germs), and a kidney stone will usually pass through your urine without further treatment. If you were put on antibiotics, take all the medicine until gone. You may feel better in a few days, but take all of your medicine or the infection may not respond and become more difficult to treat. If antibiotics were not given, an infection did not cause the blood in the urine. A further work up to find out the reason may be needed. HOME CARE INSTRUCTIONS   Drink lots of fluid, 3 to 4 quarts a day. If you have been diagnosed with an infection, cranberry juice is especially recommended, in addition to large amounts of water.   Avoid caffeine, tea, and carbonated beverages, because they tend to irritate the bladder.   Avoid alcohol as it may irritate the prostate.   Only take over-the-counter or prescription medicines for pain, discomfort, or fever as directed by your caregiver.   If you have been diagnosed with a kidney stone follow your caregivers instructions regarding straining your urine to catch the stone.  TO PREVENT FURTHER INFECTIONS:  Empty the bladder often. Avoid holding urine for long periods of time.   After a bowel movement, women should cleanse front to back. Use each tissue only once.   Empty the bladder before and after sexual intercourse if you are a male.   Return to your caregiver if you develop back pain, fever, nausea (feeling sick to your stomach), vomiting, or your  symptoms (problems) are not better in 3 days. Return sooner if you are getting worse.  If you have been requested to return for further testing make sure to keep your appointments. If an infection is not the cause of blood in your urine, X-rays may be required. Your caregiver will discuss this with you. SEEK IMMEDIATE MEDICAL CARE IF:   You have a persistent fever over 102 F (38.9 C).   You develop severe vomiting and are unable to keep the medication down.   You develop severe back or abdominal pain despite taking your medications.   You begin passing a large amount of blood or clots in your urine.   You feel extremely weak or faint, or pass out.  MAKE SURE YOU:   Understand these instructions.   Will watch your condition.   Will get help right away if you are not doing well or get worse.  Document Released: 05/24/2005 Document Revised: 05/13/2011 Document Reviewed: 01/11/2008 [facility] Patient Information 2012 [place], [place].

## 2011-10-31 NOTE — ED Provider Notes (Signed)
History     CSN: [id number]  Arrival date & time 10/31/11  1407   None     Chief Complaint  Patient presents with  . Numbness  . Altered Mental Status    (Consider location/radiation/quality/duration/timing/severity/associated sxs/prior treatment) Patient is a 61 y.o. male presenting with altered mental status. The history is provided by the patient. No language interpreter was used.  Altered Mental Status This is a recurrent problem. The current episode started today. The problem occurs daily. Associated symptoms include numbness. Pertinent negatives include no chest pain, coughing, fever, headaches, nausea, neck pain, visual change, vomiting or weakness. The symptoms are aggravated by smoking. He has tried nothing for the symptoms.  Patient presents to ER via EMS alone after waking with R facial tingling/numbness.  Family reports change in LOC. No specifics.   PMH of stroke with R sided weakness and RUE contracture.  Pt reports unable to ambulate. Family cares for him in home.   Daily drinker and smoker.  Last etoh 3 days ago.  No sign of withdrawal presently.  No new neurologic changes or weaknesses except tingling to the R face.  No family at bedside.  pmh stoke, cad, diabetes, etoh and substance abuse.  OHS, carotid endarterectomy.    Past Medical History  Diagnosis Date  . Coronary artery disease   . Hypertension   . Diabetes mellitus   . Stroke   . Anxiety   . Substance abuse   . Alcohol abuse     Past Surgical History  Procedure Date  . Cardiac surgery     CABG  . Carotid endarterectomy     left side  . Heart stents     History reviewed. No pertinent family history.  History  Substance Use Topics  . Smoking status: Current Everyday Smoker  . Smokeless tobacco: Not on file  . Alcohol Use: Yes     daily      Review of Systems  Constitutional: Negative.  Negative for fever.  HENT: Negative.  Negative for neck pain.   Eyes: Negative.   Respiratory:  Negative.  Negative for cough.   Cardiovascular: Positive for leg swelling. Negative for chest pain.       R ankle swelling no pain pt states this is chronic.  Gastrointestinal: Negative.  Negative for nausea and vomiting.  Neurological: Positive for numbness. Negative for weakness and headaches.  Psychiatric/Behavioral: Positive for altered mental status. Negative for suicidal ideas. The patient is nervous/anxious.   All other systems reviewed and are negative.    Allergies  Penicillins  Home Medications   Current Outpatient Rx  Name Route Sig Dispense Refill  . ALPRAZOLAM 1 MG PO TABS Oral Take 1 mg by mouth 4 (four) times daily as needed. For anxiety    . ASPIRIN-SALICYLAMIDE-CAFFEINE 962-952-84 MG PO PACK Oral Take 1 Package by mouth daily as needed. As needed for pain     . CELECOXIB 200 MG PO CAPS Oral Take 1 capsule (200 mg total) by mouth 2 (two) times daily. 30 capsule 2  . HALOPERIDOL 2 MG PO TABS Oral Take 1 tablet (2 mg total) by mouth 2 (two) times daily. 30 tablet 5  . LAMOTRIGINE 200 MG PO TABS Oral Take 1 tablet (200 mg total) by mouth 2 (two) times daily. 60 tablet 11  . METOPROLOL SUCCINATE ER 50 MG PO TB24 Oral Take 1 tablet (50 mg total) by mouth daily. 90 tablet 3  . ADULT MULTIVITAMIN W/MINERALS CH Oral Take 1 tablet  by mouth daily.      Marland Kitchen PAROXETINE HCL 40 MG PO TABS Oral Take 1 tablet (40 mg total) by mouth every morning. 30 tablet 11  . RAMIPRIL 5 MG PO CAPS Oral Take 1 capsule (5 mg total) by mouth daily. 30 capsule 11  . CIPROFLOXACIN HCL 500 MG PO TABS Oral Take 1 tablet (500 mg total) by mouth 2 (two) times daily. 20 tablet 0    Please inform family that I do not have any record ...    BP 154/71  Temp(Src) 98.6 F (37 C) (Oral)  Resp 20  SpO2 96%  Physical Exam  Nursing note and vitals reviewed. Constitutional: He is oriented to person, place, and time. He appears well-developed and well-nourished.  HENT:  Head: Normocephalic.  Eyes: Conjunctivae  and EOM are normal. Pupils are equal, round, and reactive to light.  Neck: Normal range of motion. Neck supple.  Cardiovascular: Normal rate.        OHS and Carotid endarcterectomy scars.   Pulmonary/Chest: Effort normal and breath sounds normal. No respiratory distress.  Abdominal: Soft. Bowel sounds are normal. He exhibits no distension. There is no tenderness. There is no rebound and no guarding.  Musculoskeletal: Normal range of motion.       RUE contracted   Neurological: He is alert and oriented to person, place, and time.  Skin: Skin is warm and dry.  Psychiatric: He has a normal mood and affect.       anxious    ED Course  Procedures (including critical care time)   Labs Reviewed  CBC  DIFFERENTIAL  BASIC METABOLIC PANEL  URINALYSIS, ROUTINE W REFLEX MICROSCOPIC  ETHANOL   No results found.   No diagnosis found.    MDM    Date: 10/31/2011  Rate:66  Rhythm: normal sinus rhythm  QRS Axis: normal  Intervals: normal  ST/T Wave abnormalities: ST depressions laterally  Conduction Disutrbances:none  Narrative Interpretation:   Old EKG Reviewed: unchanged  R facial tingling upon awakening 6am.  CT head shows no stroke.  No neuro defecits in the ER except pre-existing ones from prior stroke.  Labs unremarkable.  Hematuria with no infection.  Recheck urine this week at pcp.  Continue asa per day.  Avoid etoh and cigarettes.  Labs Reviewed  CBC - Abnormal; Notable for the following:    Hemoglobin 18.0 (*)    All other components within normal limits  DIFFERENTIAL - Abnormal; Notable for the following:    Neutrophils Relative 80 (*)    Neutro Abs 8.3 (*)    All other components within normal limits  BASIC METABOLIC PANEL - Abnormal; Notable for the following:    Sodium 133 (*)    Chloride 93 (*)    Glucose, Bld 122 (*)    All other components within normal limits  URINALYSIS, ROUTINE W REFLEX MICROSCOPIC - Abnormal; Notable for the following:    Color, Urine  AMBER (*) BIOCHEMICALS MAY BE AFFECTED BY COLOR   APPearance CLOUDY (*)    Hgb urine dipstick MODERATE (*)    Bilirubin Urine MODERATE (*)    Ketones, ur >80 (*)    Protein, ur 100 (*)    Urobilinogen, UA 2.0 (*)    Leukocytes, UA SMALL (*)    All other components within normal limits  ETHANOL  URINE MICROSCOPIC-ADD ON          Remi Haggard, NP 10/31/11 1718

## 2011-10-31 NOTE — ED Notes (Signed)
Son called left phone number for contact: [phone] Loraine Leriche)

## 2011-11-01 ENCOUNTER — Emergency Department (HOSPITAL_COMMUNITY)
Admission: EM | Admit: 2011-11-01 | Discharge: 2011-11-01 | Disposition: A | Discharge status: Home / Self Care | Payer: Medicare Other | Attending: Emergency Medicine | Admitting: Emergency Medicine

## 2011-11-01 ENCOUNTER — Encounter (HOSPITAL_COMMUNITY): Payer: Self-pay

## 2011-11-01 DIAGNOSIS — H538 Other visual disturbances: Secondary | ICD-10-CM | POA: Insufficient documentation

## 2011-11-01 DIAGNOSIS — R5381 Other malaise: Secondary | ICD-10-CM | POA: Insufficient documentation

## 2011-11-01 DIAGNOSIS — I2510 Atherosclerotic heart disease of native coronary artery without angina pectoris: Secondary | ICD-10-CM | POA: Insufficient documentation

## 2011-11-01 DIAGNOSIS — E119 Type 2 diabetes mellitus without complications: Secondary | ICD-10-CM | POA: Insufficient documentation

## 2011-11-01 DIAGNOSIS — R569 Unspecified convulsions: Secondary | ICD-10-CM | POA: Insufficient documentation

## 2011-11-01 DIAGNOSIS — I10 Essential (primary) hypertension: Secondary | ICD-10-CM | POA: Insufficient documentation

## 2011-11-01 DIAGNOSIS — F19939 Other psychoactive substance use, unspecified with withdrawal, unspecified: Secondary | ICD-10-CM | POA: Insufficient documentation

## 2011-11-01 DIAGNOSIS — Z79899 Other long term (current) drug therapy: Secondary | ICD-10-CM | POA: Insufficient documentation

## 2011-11-01 DIAGNOSIS — R5383 Other fatigue: Secondary | ICD-10-CM | POA: Insufficient documentation

## 2011-11-01 DIAGNOSIS — F13239 Sedative, hypnotic or anxiolytic dependence with withdrawal, unspecified: Secondary | ICD-10-CM

## 2011-11-01 DIAGNOSIS — F1310 Sedative, hypnotic or anxiolytic abuse, uncomplicated: Secondary | ICD-10-CM | POA: Insufficient documentation

## 2011-11-01 DIAGNOSIS — Z8673 Personal history of transient ischemic attack (TIA), and cerebral infarction without residual deficits: Secondary | ICD-10-CM | POA: Insufficient documentation

## 2011-11-01 LAB — CBC
Hemoglobin: 17 g/dL (ref 13.0–17.0)
MCH: 33.7 pg (ref 26.0–34.0)
MCHC: 36.8 g/dL — ABNORMAL HIGH (ref 30.0–36.0)
MCV: 91.5 fL (ref 78.0–100.0)

## 2011-11-01 LAB — DIFFERENTIAL
Basophils Absolute: 0 10*3/uL (ref 0.0–0.1)
Basophils Relative: 0 % (ref 0–1)
Lymphocytes Relative: 5 % — ABNORMAL LOW (ref 12–46)
Monocytes Absolute: 0.7 10*3/uL (ref 0.1–1.0)
Neutro Abs: 11.5 10*3/uL — ABNORMAL HIGH (ref 1.7–7.7)
Neutrophils Relative %: 89 % — ABNORMAL HIGH (ref 43–77)

## 2011-11-01 LAB — BASIC METABOLIC PANEL
CO2: 27 mEq/L (ref 19–32)
Chloride: 92 mEq/L — ABNORMAL LOW (ref 96–112)
GFR calc Af Amer: >90 mL/min (ref >90)
Potassium: 4 mEq/L (ref 3.5–5.1)
Sodium: 130 mEq/L — ABNORMAL LOW (ref 135–145)

## 2011-11-01 MED ORDER — LORAZEPAM 2 MG/ML IJ SOLN
1.0000 mg | Freq: Once | INTRAMUSCULAR | Status: AC
  Administered 2011-11-01: 1 mg via INTRAVENOUS
  Filled 2011-11-01: qty 1

## 2011-11-01 MED ORDER — ALPRAZOLAM 1 MG PO TABS: 1.0000 mg | ORAL_TABLET | Freq: Four times a day (QID) | ORAL | Status: DC | PRN

## 2011-11-01 MED ORDER — ONDANSETRON HCL 4 MG/2ML IJ SOLN
INTRAMUSCULAR | Status: AC
  Administered 2011-11-01: 4 mg
  Filled 2011-11-01: qty 2

## 2011-11-01 NOTE — ED Provider Notes (Signed)
History     CSN: [id number]  Arrival date & time 11/01/11  1500   First MD Initiated Contact with Patient 11/01/11 1629      Chief Complaint  Patient presents with  . Seizures    withdrawal     (Consider location/radiation/quality/duration/timing/severity/associated sxs/prior treatment) Patient is a 61 y.o. male presenting with seizures. The history is provided by the patient and medical records.  Seizures    patient here after having a witnessed seizure by his son. Seen here yesterday for same issue. Her head CT and blood work which showed a UTI. Patient has no prior history of seizures however has been out of his Xanax for the last 5 days. He's also been out of his lamictal. Patient seen by his physician a week ago for a refill of his Xanax and according to medical records he was refused this.  Past Medical History  Diagnosis Date  . Coronary artery disease   . Hypertension   . Diabetes mellitus   . Stroke   . Anxiety   . Substance abuse   . Alcohol abuse     Past Surgical History  Procedure Date  . Cardiac surgery     CABG  . Carotid endarterectomy     left side  . Heart stents     No family history on file.  History  Substance Use Topics  . Smoking status: Current Everyday Smoker  . Smokeless tobacco: Not on file  . Alcohol Use: Yes     daily      Review of Systems  Neurological: Positive for seizures.  All other systems reviewed and are negative.    Allergies  Penicillins  Home Medications   Current Outpatient Rx  Name Route Sig Dispense Refill  . ALPRAZOLAM 1 MG PO TABS Oral Take 1 mg by mouth 4 (four) times daily as needed. For anxiety    . ASPIRIN-SALICYLAMIDE-CAFFEINE [phone] MG PO PACK Oral Take 1 Package by mouth daily as needed. As needed for pain     . CELECOXIB 200 MG PO CAPS Oral Take 1 capsule (200 mg total) by mouth 2 (two) times daily. 30 capsule 2  . CIPROFLOXACIN HCL 500 MG PO TABS Oral Take 1 tablet (500 mg total) by mouth 2  (two) times daily. 20 tablet 0    Please inform family that I do not have any record ...  . HALOPERIDOL 2 MG PO TABS Oral Take 1 tablet (2 mg total) by mouth 2 (two) times daily. 30 tablet 5  . LAMOTRIGINE 200 MG PO TABS Oral Take 1 tablet (200 mg total) by mouth 2 (two) times daily. 60 tablet 11  . METOPROLOL SUCCINATE ER 50 MG PO TB24 Oral Take 1 tablet (50 mg total) by mouth daily. 90 tablet 3  . ADULT MULTIVITAMIN W/MINERALS CH Oral Take 1 tablet by mouth daily.      Marland Kitchen PAROXETINE HCL 40 MG PO TABS Oral Take 1 tablet (40 mg total) by mouth every morning. 30 tablet 11  . RAMIPRIL 5 MG PO CAPS Oral Take 1 capsule (5 mg total) by mouth daily. 30 capsule 11    BP 129/58  Pulse 84  Resp 16  SpO2 100%  Physical Exam  Nursing note and vitals reviewed. Constitutional: He appears well-developed and well-nourished.  Non-toxic appearance. No distress.  HENT:  Head: Normocephalic and atraumatic.  Eyes: Conjunctivae, EOM and lids are normal. Pupils are equal, round, and reactive to light.  Neck: Normal range of motion.  Neck supple. No tracheal deviation present. No mass present.  Cardiovascular: Normal rate, regular rhythm and normal heart sounds.  Exam reveals no gallop.   No murmur heard. Pulmonary/Chest: Effort normal and breath sounds normal. No stridor. No respiratory distress. He has no decreased breath sounds. He has no wheezes. He has no rhonchi. He has no rales.  Abdominal: Soft. Normal appearance and bowel sounds are normal. He exhibits no distension. There is no tenderness. There is no rebound and no CVA tenderness.  Musculoskeletal: Normal range of motion. He exhibits no edema and no tenderness.  Neurological: He is alert. He has normal strength. No cranial nerve deficit or sensory deficit. GCS eye subscore is 4. GCS verbal subscore is 5. GCS motor subscore is 6.  Skin: Skin is warm and dry. No abrasion and no rash noted.  Psychiatric: He has a normal mood and affect. His speech is  normal and behavior is normal.    ED Course  Procedures (including critical care time)   Labs Reviewed  CBC  DIFFERENTIAL  BASIC METABOLIC PANEL   Ct Head Wo Contrast  10/31/2011  *RADIOLOGY REPORT*  Clinical Data: Weakness.  Blurred vision on the right.  Dizziness.  CT HEAD WITHOUT CONTRAST  Technique:  Contiguous axial images were obtained from the base of the skull through the vertex without contrast.  Comparison: Head CT 06/10/2011 and multiple previous  Findings:  the brain shows generalized atrophy.  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  There is an old left occipital infarction.  No sign of acute infarction, mass lesion, hemorrhage, hydrocephalus or extra-axial collection.  The calvarium is unremarkable.  Sinuses, middle ears and mastoids are clear.  No abnormalities seen of the orbits or globes.  Impression: No change.  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  Original Report Authenticated By: Thomasenia Sales, M.D.     No diagnosis found.    MDM  Pt given ativan 1mg  here, will restart xanax and pt to f/u his pcp--no seizure activty here        Toy Baker, MD 11/01/11 [phone]

## 2011-11-01 NOTE — ED Notes (Signed)
Per EMS pts family pt had a seizure d/t not taking regular medication, pt a/o, no distress noted, seizure pads applied.

## 2011-11-01 NOTE — ED Notes (Signed)
Patient given discharge instructions, information, prescriptions, and diet order. Patient states that they adequately understand discharge information given and to return to ED if symptoms return or worsen.     

## 2011-11-01 NOTE — Discharge Instructions (Signed)
Follow with your doctor for further medications

## 2011-11-01 NOTE — ED Notes (Signed)
Pt's son called, reports he witnessed pt having a seizure episode. He reports that he had his arms up over his head, eye were "red", "foaming at the mouth, shaking and his lips were black."  Reports that pt has been out of his meds x 5 days.  Was seen at [facility] last night.  He also reports that pt was incontinent with urine and was disoriented after the episode which lasted for 10 minutes.  Pt has hx of Dementia.

## 2011-11-02 ENCOUNTER — Inpatient Hospital Stay (HOSPITAL_COMMUNITY)
Admission: EM | Admit: 2011-11-02 | Discharge: 2011-11-05 | DRG: 640 | Disposition: A | Discharge status: Home / Self Care | Payer: Medicare Other | Attending: Internal Medicine | Admitting: Internal Medicine

## 2011-11-02 ENCOUNTER — Encounter (HOSPITAL_COMMUNITY): Payer: Self-pay

## 2011-11-02 DIAGNOSIS — D7289 Other specified disorders of white blood cells: Secondary | ICD-10-CM

## 2011-11-02 DIAGNOSIS — F19939 Other psychoactive substance use, unspecified with withdrawal, unspecified: Secondary | ICD-10-CM | POA: Diagnosis present

## 2011-11-02 DIAGNOSIS — F1920 Other psychoactive substance dependence, uncomplicated: Secondary | ICD-10-CM | POA: Diagnosis present

## 2011-11-02 DIAGNOSIS — E871 Hypo-osmolality and hyponatremia: Secondary | ICD-10-CM

## 2011-11-02 DIAGNOSIS — F1010 Alcohol abuse, uncomplicated: Secondary | ICD-10-CM | POA: Diagnosis present

## 2011-11-02 DIAGNOSIS — G929 Unspecified toxic encephalopathy: Secondary | ICD-10-CM | POA: Diagnosis present

## 2011-11-02 DIAGNOSIS — F1027 Alcohol dependence with alcohol-induced persisting dementia: Secondary | ICD-10-CM

## 2011-11-02 DIAGNOSIS — F22 Delusional disorders: Secondary | ICD-10-CM

## 2011-11-02 DIAGNOSIS — I10 Essential (primary) hypertension: Secondary | ICD-10-CM | POA: Diagnosis present

## 2011-11-02 DIAGNOSIS — D72829 Elevated white blood cell count, unspecified: Secondary | ICD-10-CM | POA: Diagnosis present

## 2011-11-02 DIAGNOSIS — F411 Generalized anxiety disorder: Secondary | ICD-10-CM | POA: Diagnosis present

## 2011-11-02 DIAGNOSIS — F419 Anxiety disorder, unspecified: Secondary | ICD-10-CM | POA: Diagnosis present

## 2011-11-02 DIAGNOSIS — G92 Toxic encephalopathy: Secondary | ICD-10-CM | POA: Diagnosis present

## 2011-11-02 DIAGNOSIS — R569 Unspecified convulsions: Secondary | ICD-10-CM | POA: Diagnosis present

## 2011-11-02 LAB — POCT I-STAT, CHEM 8
BUN: 15 mg/dL (ref 6–23)
Calcium, Ion: 1.1 mmol/L — ABNORMAL LOW (ref 1.12–1.32)
Chloride: 92 mEq/L — ABNORMAL LOW (ref 96–112)
HCT: 53 % — ABNORMAL HIGH (ref 39.0–52.0)
Sodium: 127 mEq/L — ABNORMAL LOW (ref 135–145)
TCO2: 28 mmol/L (ref 0–100)

## 2011-11-02 LAB — COMPREHENSIVE METABOLIC PANEL
AST: 14 U/L (ref 0–37)
Albumin: 4.1 g/dL (ref 3.5–5.2)
BUN: 14 mg/dL (ref 6–23)
Calcium: 9.7 mg/dL (ref 8.4–10.5)
Chloride: 87 mEq/L — ABNORMAL LOW (ref 96–112)
Creatinine, Ser: 0.53 mg/dL (ref 0.50–1.35)
Total Bilirubin: 0.8 mg/dL (ref 0.3–1.2)

## 2011-11-02 LAB — RAPID URINE DRUG SCREEN, HOSP PERFORMED
Amphetamines: NOT DETECTED
Barbiturates: NOT DETECTED
Benzodiazepines: NOT DETECTED
Cocaine: NOT DETECTED
Tetrahydrocannabinol: NOT DETECTED

## 2011-11-02 LAB — URINALYSIS, ROUTINE W REFLEX MICROSCOPIC
Glucose, UA: NEGATIVE mg/dL
Ketones, ur: >80 mg/dL — AB
Leukocytes, UA: NEGATIVE
Nitrite: NEGATIVE
pH: 5.5 (ref 5.0–8.0)

## 2011-11-02 LAB — CBC
MCV: 90.4 fL (ref 78.0–100.0)
Platelets: 241 10*3/uL (ref 150–400)
RBC: 5.08 MIL/uL (ref 4.22–5.81)
RDW: 12.1 % (ref 11.5–15.5)
WBC: 11.4 10*3/uL — ABNORMAL HIGH (ref 4.0–10.5)

## 2011-11-02 LAB — URINE MICROSCOPIC-ADD ON

## 2011-11-02 LAB — PHOSPHORUS: Phosphorus: 2.6 mg/dL (ref 2.3–4.6)

## 2011-11-02 LAB — MAGNESIUM: Magnesium: 1.8 mg/dL (ref 1.5–2.5)

## 2011-11-02 LAB — ETHANOL: Alcohol, Ethyl (B): <11 mg/dL (ref 0–11)

## 2011-11-02 LAB — PROTIME-INR
INR: 1 (ref 0.00–1.49)
Prothrombin Time: 13.4 seconds (ref 11.6–15.2)

## 2011-11-02 LAB — APTT: aPTT: 33 seconds (ref 24–37)

## 2011-11-02 MED ORDER — METOPROLOL SUCCINATE ER 50 MG PO TB24
50.0000 mg | ORAL_TABLET | Freq: Every day | ORAL | Status: DC
  Administered 2011-11-02 – 2011-11-05 (×4): 50 mg via ORAL
  Filled 2011-11-02 (×4): qty 1

## 2011-11-02 MED ORDER — ADULT MULTIVITAMIN W/MINERALS CH
1.0000 | ORAL_TABLET | Freq: Every day | ORAL | Status: DC
  Administered 2011-11-02 – 2011-11-05 (×4): 1 via ORAL
  Filled 2011-11-02 (×4): qty 1

## 2011-11-02 MED ORDER — PAROXETINE HCL 20 MG PO TABS
40.0000 mg | ORAL_TABLET | Freq: Every day | ORAL | Status: DC
  Administered 2011-11-02 – 2011-11-05 (×4): 40 mg via ORAL
  Filled 2011-11-02 (×4): qty 2

## 2011-11-02 MED ORDER — SODIUM CHLORIDE 0.9 % IV SOLN
INTRAVENOUS | Status: AC
  Administered 2011-11-02 – 2011-11-03 (×2): via INTRAVENOUS

## 2011-11-02 MED ORDER — ASPIRIN 81 MG PO TABS: 81.0000 mg | ORAL_TABLET | Freq: Every day | ORAL | Status: DC

## 2011-11-02 MED ORDER — LAMOTRIGINE 200 MG PO TABS
200.0000 mg | ORAL_TABLET | Freq: Two times a day (BID) | ORAL | Status: DC
  Administered 2011-11-02 – 2011-11-05 (×6): 200 mg via ORAL
  Filled 2011-11-02 (×8): qty 1

## 2011-11-02 MED ORDER — ONDANSETRON HCL 4 MG/2ML IJ SOLN: 4.0000 mg | Freq: Four times a day (QID) | INTRAMUSCULAR | Status: DC | PRN

## 2011-11-02 MED ORDER — ASPIRIN 81 MG PO CHEW
81.0000 mg | CHEWABLE_TABLET | Freq: Every day | ORAL | Status: DC
  Administered 2011-11-02 – 2011-11-05 (×4): 81 mg via ORAL
  Filled 2011-11-02 (×4): qty 1

## 2011-11-02 MED ORDER — ALPRAZOLAM 1 MG PO TABS
1.0000 mg | ORAL_TABLET | Freq: Three times a day (TID) | ORAL | Status: DC | PRN
  Administered 2011-11-02 – 2011-11-05 (×9): 1 mg via ORAL
  Filled 2011-11-02 (×10): qty 1

## 2011-11-02 MED ORDER — ONDANSETRON HCL 4 MG PO TABS: 4.0000 mg | ORAL_TABLET | Freq: Four times a day (QID) | ORAL | Status: DC | PRN

## 2011-11-02 MED ORDER — ENOXAPARIN SODIUM 30 MG/0.3ML ~~LOC~~ SOLN
30.0000 mg | SUBCUTANEOUS | Status: DC
  Administered 2011-11-02: 30 mg via SUBCUTANEOUS
  Filled 2011-11-02 (×2): qty 0.3

## 2011-11-02 MED ORDER — ACETAMINOPHEN 500 MG PO TABS
500.0000 mg | ORAL_TABLET | Freq: Four times a day (QID) | ORAL | Status: DC | PRN
  Administered 2011-11-04 – 2011-11-05 (×3): 500 mg via ORAL
  Filled 2011-11-02: qty 2
  Filled 2011-11-02 (×2): qty 1

## 2011-11-02 MED ORDER — HALOPERIDOL 2 MG PO TABS
2.0000 mg | ORAL_TABLET | Freq: Two times a day (BID) | ORAL | Status: DC
  Administered 2011-11-02 – 2011-11-05 (×6): 2 mg via ORAL
  Filled 2011-11-02 (×7): qty 1

## 2011-11-02 NOTE — Progress Notes (Signed)
Patient admitted from the ED.  Melvin Krueger can provide his name in reverse order and believes it is Oct 16, 1950.  He does not believe he is in the hospital and is able to follow commands but does not answer questions appropriately.  Most of time talking of riding horses, guns and holsters, and household issues.  He has weakness on right side.  His son phoned and states he took 120 xanax since 10/20/11 til 10/29/11.  Attempted to orient patient to room, call light, tv, etc but unable to grasp the instructions.

## 2011-11-02 NOTE — Progress Notes (Addendum)
Unable to complete patient's learning assessment due to his confusion and no family present at hospital at this time.  Attempted to stand patient with two assist to weigh him and he was unable to hold his body weight up , hoyer lift used to weigh patient.

## 2011-11-02 NOTE — ED Provider Notes (Signed)
History     CSN: [id number]  Arrival date & time 11/02/11  1047   First MD Initiated Contact with Patient 11/02/11 1052      Chief Complaint  Patient presents with  . Anxiety    (Consider location/radiation/quality/duration/timing/severity/associated sxs/prior treatment) Patient is a 61 y.o. male presenting with anxiety. The history is provided by the patient and medical records. The history is limited by the condition of the patient.  Anxiety  the pt is a 18 y male with hx of dementia and sz and alcohol abuse.  He says he got up last night and was wandering around the house.  He asked his son to call ems.   He feels fine now. He is asx.   He says he ran out of his meds.  He says she was in the ed recently and dxd with a uti. His thoughts wander from one subject aimlessly to another.  When asked what month it is, he is not able to answer.  When asked what holiday was celebrated yesterday [facility]), he was not able to answer.  When asked what his prior occupation was, he could not answer speficically.  Level 5 caveat for dementia.  Past Medical History  Diagnosis Date  . Coronary artery disease   . Hypertension   . Diabetes mellitus   . Stroke   . Anxiety   . Substance abuse   . Alcohol abuse     Past Surgical History  Procedure Date  . Cardiac surgery     CABG  . Carotid endarterectomy     left side  . Heart stents     No family history on file.  History  Substance Use Topics  . Smoking status: Current Everyday Smoker  . Smokeless tobacco: Not on file  . Alcohol Use: Yes     daily      Review of Systems  Unable to perform ROS   Allergies  Penicillins  Home Medications   Current Outpatient Rx  Name Route Sig Dispense Refill  . ALPRAZOLAM 1 MG PO TABS Oral Take 1 mg by mouth 4 (four) times daily as needed. For anxiety    . ASPIRIN-SALICYLAMIDE-CAFFEINE [phone] MG PO PACK Oral Take 1 Package by mouth daily as needed. As needed for pain     .  CELECOXIB 200 MG PO CAPS Oral Take 1 capsule (200 mg total) by mouth 2 (two) times daily. 30 capsule 2  . CIPROFLOXACIN HCL 500 MG PO TABS Oral Take 1 tablet (500 mg total) by mouth 2 (two) times daily. 20 tablet 0    Please inform family that I do not have any record ...  . HALOPERIDOL 2 MG PO TABS Oral Take 1 tablet (2 mg total) by mouth 2 (two) times daily. 30 tablet 5  . LAMOTRIGINE 200 MG PO TABS Oral Take 1 tablet (200 mg total) by mouth 2 (two) times daily. 60 tablet 11  . METOPROLOL SUCCINATE ER 50 MG PO TB24 Oral Take 1 tablet (50 mg total) by mouth daily. 90 tablet 3  . ADULT MULTIVITAMIN W/MINERALS CH Oral Take 1 tablet by mouth daily.      Marland Kitchen PAROXETINE HCL 40 MG PO TABS Oral Take 1 tablet (40 mg total) by mouth every morning. 30 tablet 11  . RAMIPRIL 5 MG PO CAPS Oral Take 1 capsule (5 mg total) by mouth daily. 30 capsule 11    BP 204/89  Pulse 81  Temp(Src) 98.1 F (36.7 C) (Oral)  Resp 18  SpO2 98%  Physical Exam  Constitutional: He appears well-developed and well-nourished. No distress.  HENT:  Head: Normocephalic and atraumatic.  Eyes: Conjunctivae and EOM are normal.  Neck: Normal range of motion. Neck supple.  Cardiovascular: Normal rate.   No murmur heard. Pulmonary/Chest: Effort normal. No respiratory distress.  Abdominal: Soft. He exhibits no distension. There is no tenderness.  Musculoskeletal: Normal range of motion.  Neurological: He is alert.       confused  Skin: Skin is warm and dry.  Psychiatric:       confused    ED Course  Procedures (including critical care time) 66 y male with hx of alcohol abuse and dementia presents for eval.  He is asx now.   In no distress.  He denies any sxs.  Will check chem and ua.    12:34 PM Son called to provide more hx.  He said he wanted to clarify that his father does NOT have "anxiety" as is listed on the computer.  He says his father has not had his xanax for several days so he is having "withdrawals."  He says  his father has bouts of crying and he wakes up at night and then wakes his son up. The son says he is not able to care for his father any more.   I explained that there is no evidence of withdrawal at this time.  I will do my evaluation and admit the pt if there is an indication but he will be release if there is no indication for admission.    Labs Reviewed  URINALYSIS, ROUTINE W REFLEX MICROSCOPIC   Ct Head Wo Contrast  10/31/2011  *RADIOLOGY REPORT*  Clinical Data: Weakness.  Blurred vision on the right.  Dizziness.  CT HEAD WITHOUT CONTRAST  Technique:  Contiguous axial images were obtained from the base of the skull through the vertex without contrast.  Comparison: Head CT 06/10/2011 and multiple previous  Findings:  the brain shows generalized atrophy.  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  There is an old left occipital infarction.  No sign of acute infarction, mass lesion, hemorrhage, hydrocephalus or extra-axial collection.  The calvarium is unremarkable.  Sinuses, middle ears and mastoids are clear.  No abnormalities seen of the orbits or globes.  Impression: No change.  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  Original Report Authenticated By: Thomasenia Sales, M.D.     No diagnosis found.  2:03 PM Spoke with dr. Elisabeth Pigeon.  She will admit for hyponatremia  MDM  Dementia Hyponatremia        Cheri Guppy, MD 11/02/11 1404

## 2011-11-02 NOTE — ED Notes (Signed)
Pt son called up date on care given son stated that pt took all of  Xanax filled on the 10/20/11 120 pills. Son thinks his father is withdraw ling and that he can not take care of him he needs to stay here to get helps. Son spoke to MD and he stated that he will admit if needed

## 2011-11-02 NOTE — ED Notes (Signed)
Per ems, pt was dx with benzo withdrawal, pt called ems today for anxiety. Pt calm in room

## 2011-11-02 NOTE — H&P (Signed)
PCP:  Provider Not In System   DOA:  11/02/2011 10:47 AM  Chief Complaint:  Confusion  HPI: 61 year old male with history of HTN, dementia, anxiety and alcohol abuse who presented to ED with acute mental status changes. On arrival to ED patient was able to provide history however at this time patient is too somnolent to participate in conversation. Per ED chart patient reported he was wondering around the house and could not remember all his medication. At this time patient is stabilized hemodynamically in ED. We will obtain further history once more alert.  Allergies: Allergies  Allergen Reactions  . Penicillins Rash    Prior to Admission medications   Medication Sig Start Date End Date Taking? Authorizing Provider  acetaminophen (TYLENOL) 500 MG tablet Take 500 mg by mouth every 6 (six) hours as needed. Pain   Yes Historical Provider, MD  ALPRAZolam Prudy Feeler) 1 MG tablet Take 1 mg by mouth 4 (four) times daily as needed. For anxiety   Yes Historical Provider, MD  aspirin 81 MG tablet Take 81 mg by mouth daily.   Yes Historical Provider, MD  Aspirin-Salicylamide-Caffeine (BC FAST PAIN RELIEF ARTHRITIS) [phone] MG PACK Take 1 Package by mouth daily as needed. As needed for pain    Yes Historical Provider, MD  celecoxib (CELEBREX) 200 MG capsule Take 1 capsule (200 mg total) by mouth 2 (two) times daily. 10/18/11 11/17/11 Yes Elvina Sidle, MD  haloperidol (HALDOL) 2 MG tablet Take 1 tablet (2 mg total) by mouth 2 (two) times daily. 10/23/11 11/22/11 Yes Elvina Sidle, MD  lamoTRIgine (LAMICTAL) 200 MG tablet Take 1 tablet (200 mg total) by mouth 2 (two) times daily. 10/18/11 10/17/12 Yes Elvina Sidle, MD  metoprolol succinate (TOPROL-XL) 50 MG 24 hr tablet Take 1 tablet (50 mg total) by mouth daily. 10/18/11  Yes Elvina Sidle, MD  Multiple Vitamin (MULITIVITAMIN WITH MINERALS) TABS Take 1 tablet by mouth daily.     Yes Historical Provider, MD  PARoxetine (PAXIL) 40 MG tablet Take 1  tablet (40 mg total) by mouth every morning. 10/18/11 10/17/12 Yes Elvina Sidle, MD  ciprofloxacin (CIPRO) 500 MG tablet Take 1 tablet (500 mg total) by mouth 2 (two) times daily. 10/22/11 11/01/11  Elvina Sidle, MD    Past Medical History  Diagnosis Date  . Coronary artery disease   . Hypertension   . Diabetes mellitus   . Stroke   . Anxiety   . Substance abuse   . Alcohol abuse     Past Surgical History  Procedure Date  . Cardiac surgery     CABG  . Carotid endarterectomy     left side  . Heart stents     Social History:  reports that he has been smoking.  He has never used smokeless tobacco. He reports that he drinks alcohol. He reports that he does not use illicit drugs.  History reviewed. No pertinent family history.  Review of Systems:  Unable to assess due to patient's mental status  Physical Exam:  Filed Vitals:   11/02/11 1101 11/02/11 1202 11/02/11 1514 11/02/11 1515  BP: 204/89 185/90    Pulse: 81 86    Temp: 98.1 F (36.7 C)  98.1 F (36.7 C) 99.5 F (37.5 C)  TempSrc: Oral  Oral Rectal  Resp: 18 23    SpO2: 98% 97%      Constitutional: Vital signs reviewed.  Patient is in no acute distress, Somnolent Head: Normocephalic and atraumatic Ear: TM normal bilaterally Mouth:  no erythema or exudates, MMM Eyes: PERRL, EOMI, conjunctivae normal, No scleral icterus.  Neck: Supple, Trachea midline normal ROM, No JVD, mass, thyromegaly, or carotid bruit present.  Cardiovascular: RRR, S1 normal, S2 normal, no MRG, pulses symmetric and intact bilaterally Pulmonary/Chest: CTAB, no wheezes, rales, or rhonchi Abdominal: Soft. Non-tender, non-distended, bowel sounds are normal, no masses, organomegaly, or guarding present.  GU: no CVA tenderness Musculoskeletal: No joint deformities, erythema, or stiffness, ROM full and no nontender Ext: no edema and no cyanosis, pulses palpable bilaterally (DP and PT) Hematology: no cervical, inginal, or axillary adenopathy.    Neurological: right hemiparesis.  Skin: Warm, dry and intact. No rash, cyanosis, or clubbing.  Psychiatric: unable to assess due to patient's mental status  Labs on Admission:  Results for orders placed during the hospital encounter of 11/02/11 (from the past 48 hour(s))  POCT I-STAT, CHEM 8     Status: Abnormal   Collection Time   11/02/11 11:55 AM      Component Value Range Comment   Sodium 127 (*) 135 - 145 (mEq/L)    Potassium 3.7  3.5 - 5.1 (mEq/L)    Chloride 92 (*) 96 - 112 (mEq/L)    BUN 15  6 - 23 (mg/dL)    Creatinine, Ser 8.11  0.50 - 1.35 (mg/dL)    Glucose, Bld 914 (*) 70 - 99 (mg/dL)    Calcium, Ion 7.82 (*) 1.12 - 1.32 (mmol/L)    TCO2 28  0 - 100 (mmol/L)    Hemoglobin 18.0 (*) 13.0 - 17.0 (g/dL)    HCT 95.6 (*) 21.3 - 52.0 (%)   URINALYSIS, ROUTINE W REFLEX MICROSCOPIC     Status: Abnormal   Collection Time   11/02/11  1:03 PM      Component Value Range Comment   Color, Urine AMBER (*) YELLOW  BIOCHEMICALS MAY BE AFFECTED BY COLOR   APPearance CLEAR  CLEAR     Specific Gravity, Urine 1.022  1.005 - 1.030     pH 5.5  5.0 - 8.0     Glucose, UA NEGATIVE  NEGATIVE (mg/dL)    Hgb urine dipstick MODERATE (*) NEGATIVE     Bilirubin Urine SMALL (*) NEGATIVE     Ketones, ur >80 (*) NEGATIVE (mg/dL)    Protein, ur 086 (*) NEGATIVE (mg/dL)    Urobilinogen, UA 1.0  0.0 - 1.0 (mg/dL)    Nitrite NEGATIVE  NEGATIVE     Leukocytes, UA NEGATIVE  NEGATIVE    URINE MICROSCOPIC-ADD ON     Status: Abnormal   Collection Time   11/02/11  1:03 PM      Component Value Range Comment   Squamous Epithelial / LPF FEW (*) RARE     RBC / HPF 3-6  <3 (RBC/hpf)    Bacteria, UA FEW (*) RARE     Urine-Other MUCOUS PRESENT     CBC     Status: Abnormal   Collection Time   11/02/11  2:30 PM      Component Value Range Comment   WBC 11.4 (*) 4.0 - 10.5 (K/uL)    RBC 5.08  4.22 - 5.81 (MIL/uL)    Hemoglobin 16.9  13.0 - 17.0 (g/dL)    HCT 57.8  46.9 - 62.9 (%)    MCV 90.4  78.0 - 100.0 (fL)     MCH 33.3  26.0 - 34.0 (pg)    MCHC 36.8 (*) 30.0 - 36.0 (g/dL)    RDW 52.8  41.3 - 24.4 (%)  Platelets 241  150 - 400 (K/uL)   PHOSPHORUS     Status: Normal   Collection Time   11/02/11  2:30 PM      Component Value Range Comment   Phosphorus 2.6  2.3 - 4.6 (mg/dL)   APTT     Status: Normal   Collection Time   11/02/11  2:30 PM      Component Value Range Comment   aPTT 33  24 - 37 (seconds)   PROTIME-INR     Status: Normal   Collection Time   11/02/11  2:30 PM      Component Value Range Comment   Prothrombin Time 13.4  11.6 - 15.2 (seconds)    INR 1.00  0.00 - 1.49      Radiological Exams on Admission: No results found.  Assessment/Plan  Principal Problem:   *Hyponatremia - likely secondary to alcohol abuse  - sodium 127 on admission - we will continue IV fluids NS @ 75 cc /hr - CT head on admission within normal limits - follow up BMP in am  Active Problems:  Confusion - secondary to dementia; unsure if level of sodium correlates with his level of confusion; this may me his baseline mental status specially considering he has a history of alcohol abuse - will check UDS and EtOH level - CT head normal on admission  Hypertension - BP 145/82 - continue metoprolol   Leukocytosis - stress demargination - patient was on cipro from 10/18/2011 for 7 days prescribed by PCP - current urinalysis not significant for LE or nitrites   Alcohol abuse - check alcohol level - obtain UDS - ativan PRN    Anxiety - stable - ativan PRN only  DVT Prophylaxis - lovenox sub Q  Code Status - full code   Time Spent on Admission: Over 30 minutes  Obie Silos 11/02/2011, 5:02 PM  [facility] Pager # [phone] Main Office # [phone]

## 2011-11-02 NOTE — Plan of Care (Signed)
Problem: Consults Goal: General Medical Patient Education See Patient Education Module for specific education. Outcome: Not Progressing Patient is confused and unable to participate in learning assessment or teaching at this time.  No one is with patient at time of admission  Problem: Phase I Progression Outcomes Goal: Pain controlled with appropriate interventions Patient states no pain at this time Goal: OOB as tolerated unless otherwise ordered Outcome: Not Progressing Attempted to stand patient on the scale and he is very unsteady and unable to support his body weight while standing Goal: Voiding-avoid urinary catheter unless indicated Patient has condom catheter in place

## 2011-11-02 NOTE — ED Notes (Signed)
[id number]<AV> Expected date:11/02/11<BR> Expected time:10:36 AM<BR> Means of arrival:Ambulance<BR> Comments:<BR> Anxiety/unable to fill xanax rx

## 2011-11-02 NOTE — ED Notes (Signed)
Pt is confused trying to get oob reoriented with little success.Will monitor closely

## 2011-11-02 NOTE — ED Notes (Signed)
Report called x2 nurse unavailable at this called at 1515 and 1540

## 2011-11-03 DIAGNOSIS — E871 Hypo-osmolality and hyponatremia: Secondary | ICD-10-CM

## 2011-11-03 DIAGNOSIS — I10 Essential (primary) hypertension: Secondary | ICD-10-CM

## 2011-11-03 DIAGNOSIS — F19939 Other psychoactive substance use, unspecified with withdrawal, unspecified: Secondary | ICD-10-CM

## 2011-11-03 LAB — CBC
HCT: 46.2 % (ref 39.0–52.0)
Hemoglobin: 16.5 g/dL (ref 13.0–17.0)
MCH: 33.3 pg (ref 26.0–34.0)
MCHC: 35.7 g/dL (ref 30.0–36.0)
RDW: 12.6 % (ref 11.5–15.5)

## 2011-11-03 LAB — COMPREHENSIVE METABOLIC PANEL
ALT: 8 U/L (ref 0–53)
AST: 20 U/L (ref 0–37)
Alkaline Phosphatase: 89 U/L (ref 39–117)
CO2: 30 mEq/L (ref 19–32)
Calcium: 9.3 mg/dL (ref 8.4–10.5)
GFR calc non Af Amer: >90 mL/min (ref >90)
Potassium: 4.2 mEq/L (ref 3.5–5.1)
Sodium: 136 mEq/L (ref 135–145)
Total Protein: 6.4 g/dL (ref 6.0–8.3)

## 2011-11-03 LAB — HEMOGLOBIN A1C: Hgb A1c MFr Bld: 5.5 % (ref <5.7)

## 2011-11-03 MED ORDER — ENOXAPARIN SODIUM 40 MG/0.4ML ~~LOC~~ SOLN
40.0000 mg | SUBCUTANEOUS | Status: DC
  Administered 2011-11-03 – 2011-11-04 (×2): 40 mg via SUBCUTANEOUS
  Filled 2011-11-03 (×3): qty 0.4

## 2011-11-03 NOTE — Evaluation (Signed)
Physical Therapy Evaluation Patient Details Name: Melvin Krueger MRN: [id number] DOB: [birth year] Today's Date: 11/03/2011 Time: 1203-1216 PT Time Calculation (min): 13 min  PT Assessment / Plan / Recommendation Clinical Impression  61 yo male admitted with hyponatremia and some confusion. Pt lives with son who assists with ADLs/mobility. Plan is to return home per pt. Recommend HHPT and 24 hour supervision initially at discharge, if pt discharges home.    PT Assessment  Patient needs continued PT services    Follow Up Recommendations  Home health PT;Supervision/Assistance - 24 hour    Barriers to Discharge        lEquipment Recommendations  None recommended by PT    Recommendations for Other Services OT consult   Frequency Min 3X/week    Precautions / Restrictions Precautions Precautions: Fall Restrictions Weight Bearing Restrictions: No   Pertinent Vitals/Pain       Mobility  Bed Mobility Bed Mobility: Supine to Sit Supine to Sit: 4: Min guard;HOB elevated;With rails Transfers Transfers: Sit to Stand;Stand to Sit Sit to Stand: 4: Min assist;From bed;With upper extremity assist Stand to Sit: 4: Min assist;To chair/3-in-1;With upper extremity assist;With armrests Details for Transfer Assistance: VCs safety, technique, hand placement. Assist to rise, stabilize, control descent, place and maintain R hand onto walker handle. Encouraged pt to use L UE/hand to push up from , reach back for surfaces Ambulation/Gait Ambulation/Gait Assistance: 4: Min assist Ambulation Distance (Feet): 75 Feet Assistive device: Rolling walker Ambulation/Gait Assistance Details: VCs safety. Assist to stabilize pt throughout ambulation, steer RW. and maintain grip with R hand. R LE ataxic-poor control during swing through and initial contact phases of gait.  Gait Pattern: Step-through pattern;Ataxic;Decreased stride length    Exercises     PT Diagnosis: Difficulty walking;Abnormality of  gait;Generalized weakness  PT Problem List: Decreased strength;Decreased mobility;Decreased balance;Decreased knowledge of use of DME;Decreased coordination;Impaired sensation PT Treatment Interventions: DME instruction;Gait training;Functional mobility training;Therapeutic activities;Therapeutic exercise;Balance training;Patient/family education   PT Goals Acute Rehab PT Goals PT Goal Formulation: With patient Time For Goal Achievement: 11/10/11 Potential to Achieve Goals: Good Pt will go Supine/Side to Sit: with supervision PT Goal: Supine/Side to Sit - Progress: Goal set today Pt will go Sit to Supine/Side: with supervision PT Goal: Sit to Supine/Side - Progress: Goal set today Pt will go Sit to Stand: with supervision PT Goal: Sit to Stand - Progress: Goal set today Pt will Transfer Bed to Chair/Chair to Bed: with supervision PT Transfer Goal: Bed to Chair/Chair to Bed - Progress: Goal set today Pt will Ambulate: with min assist;with least restrictive assistive device;51 - 150 feet PT Goal: Ambulate - Progress: Goal set today  Visit Information  Last PT Received On: 11/03/11 Assistance Needed: +1    Subjective Data  Subjective: "I haven't been walking because I've fallen so much.....and I'm scared of falling" Patient Stated Goal: Home   Prior Functioning  Home Living Lives With: Son Type of Home: Apartment Home Access: Level entry Home Layout: One level Home Adaptive Equipment: Wheelchair - powered;Straight cane;Walker - rolling Prior Function Level of Independence: Needs assistance Needs Assistance: Transfers;Dressing Transfer Assistance: sitting down into tub. son occasionally assists with dressing. son assisted with transfers. scoot/squat-pivot transfers only Comments: transfers only. uses mobilized wheelchair mostly.    Cognition  Overall Cognitive Status: Appears within functional limits for tasks assessed/performed Arousal/Alertness: Awake/alert Orientation  Level: Appears intact for tasks assessed Behavior During Session: [facility] for tasks performed    Extremity/Trunk Assessment Right Lower Extremity Assessment RLE ROM/Strength/Tone: Deficits  RLE ROM/Strength/Tone Deficits: knee ext 3+/5, DF/PT 3/5 RLE Sensation: Deficits RLE Sensation Deficits: Diminished light touch and proprioception R LE RLE Coordination: Deficits RLE Coordination Deficits: Difficulty with heel-toe tap. R LE ataxic-poor control especially during swing through and initial contact phases of gait Left Lower Extremity Assessment LLE ROM/Strength/Tone: WFL for tasks assessed LLE Coordination: WFL - gross motor   Balance    End of Session PT - End of Session Equipment Utilized During Treatment: Gait belt Activity Tolerance: Patient tolerated treatment well Patient left: in chair;with call bell/phone within reach;with chair alarm set   Rebeca Alert [facility] 11/03/2011, 1:38 PM [phone]

## 2011-11-03 NOTE — Progress Notes (Signed)
Pharmacy Brief Note  Patient on Lovenox 30mg  SQ q24h for DVT prophylaxis, has standing order for pharmacy to adjust dosage as needed.  Wt 76.5kg SCr 0.64 CrCl 51mL/min Hgb 16.5 Pltc 195K  No bleeding reported in chart notes.  Plan:  Increase Lovenox to standard DVT prophylaxis dosage (40mg  SQ q24h).  Elie Goody, PharmD, BCPS Pager: [phone] 11/03/2011  11:48 AM

## 2011-11-03 NOTE — Progress Notes (Signed)
Subjective: Patient presently fully awake and alert. His nurse reports that he's been awake and alert during the day today. He reviewed the patient's records there is notation from his primary care physician Dr. Faustino Congress in fact that the patient reported that he lost his Xanax and was without his medication The patient reports that he came to the hospital another prescription for Xanax however is been unable to fill it. The nurse reports that the patient's confusion appeared to clear after he received his dose of Xanax Based on the patient's report and the observed clinical course I suspect this patient may have had a seizure secondary to withdrawal from Xanax as well as his hyponatremic state. The patient admits that he's still using alcohol and last use was approximately 2 weeks ago. The patient's only complaint is that he feels that he is having some jitters on the right side of his body.  Objective: Filed Vitals:   11/03/11 0525 11/03/11 1429 11/03/11 1500 11/03/11 1555  BP: 158/58 113/68    Pulse: 68 58    Temp: 97.9 F (36.6 C) 98.8 F (37.1 C)    TempSrc: Axillary Oral    Resp: 20 20    Height:      Weight:   70.67 kg (155 lb 12.8 oz) 70.67 kg (155 lb 12.8 oz)  SpO2: 93% 95%     Weight change:   Intake/Output Summary (Last 24 hours) at 11/03/11 1916 Last data filed at 11/03/11 1650  Gross per 24 hour  Intake    845 ml  Output   2500 ml  Net  -1655 ml    General: Alert, awake, oriented x3, in no acute distress.  HEENT: [place]/AT PEERL, EOMI Neck: Trachea midline,  no masses, no thyromegal,y no JVD, no carotid bruit OROPHARYNX:  Moist, No exudate/ erythema/lesions.  Heart: Regular rate and rhythm, without murmurs, rubs, gallops, PMI non-displaced, no heaves or thrills on palpation.  Lungs: Clear to auscultation, no wheezing or rhonchi noted.   Abdomen: Soft, nontender, nondistended, positive bowel sounds, no masses no hepatosplenomegaly noted..  Neuro: No focal neurological  deficits noted. Musculoskeletal: No warm swelling or erythema around joints, no spinal tenderness noted. Psychiatric: Patient alert and oriented x3, good recent to remote recall.   Lab Results:  Basename 11/03/11 0500 11/02/11 1430  NA 136 129*  K 4.2 3.7  CL 97 87*  CO2 30 24  GLUCOSE 69* 124*  BUN 9 14  CREATININE 0.64 0.53  CALCIUM 9.3 9.7  MG -- 1.8  PHOS -- 2.6    Basename 11/03/11 0500 11/02/11 1430  AST 20 14  ALT 8 10  ALKPHOS 89 110  BILITOT 0.7 0.8  PROT 6.4 7.2  ALBUMIN 3.5 4.1   No results found for this basename: LIPASE:2,AMYLASE:2 in the last 72 hours  Basename 11/03/11 0500 11/02/11 1430 11/01/11 1700  WBC 6.8 11.4* --  NEUTROABS -- -- 11.5*  HGB 16.5 16.9 --  HCT 46.2 45.9 --  MCV 93.1 90.4 --  PLT 195 241 --   No results found for this basename: CKTOTAL:3,CKMB:3,CKMBINDEX:3,TROPONINI:3 in the last 72 hours No components found with this basename: POCBNP:3 No results found for this basename: DDIMER:2 in the last 72 hours  Basename 11/02/11 1430  HGBA1C 5.5   No results found for this basename: CHOL:2,HDL:2,LDLCALC:2,TRIG:2,CHOLHDL:2,LDLDIRECT:2 in the last 72 hours  Basename 11/02/11 1430  TSH 0.964  T4TOTAL --  T3FREE --  THYROIDAB --   No results found for this basename: VITAMINB12:2,FOLATE:2,FERRITIN:2,TIBC:2,IRON:2,RETICCTPCT:2 in the  last 72 hours  Micro Results: No results found for this or any previous visit (from the past 240 hour(s)).  Studies/Results: Ct Head Wo Contrast  10/31/2011  *RADIOLOGY REPORT*  Clinical Data: Weakness.  Blurred vision on the right.  Dizziness.  CT HEAD WITHOUT CONTRAST  Technique:  Contiguous axial images were obtained from the base of the skull through the vertex without contrast.  Comparison: Head CT 06/10/2011 and multiple previous  Findings:  the brain shows generalized atrophy.  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  There is an old left occipital infarction.  No sign of acute infarction,  mass lesion, hemorrhage, hydrocephalus or extra-axial collection.  The calvarium is unremarkable.  Sinuses, middle ears and mastoids are clear.  No abnormalities seen of the orbits or globes.  Impression: No change.  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  Original Report Authenticated By: Thomasenia Sales, M.D.    Medications: I have reviewed the patient's current medications. Scheduled Meds:   . aspirin  81 mg Oral Daily  . enoxaparin (LOVENOX) injection  40 mg Subcutaneous Q24H  . haloperidol  2 mg Oral BID  . lamoTRIgine  200 mg Oral BID  . metoprolol succinate  50 mg Oral Daily  . mulitivitamin with minerals  1 tablet Oral Daily  . PARoxetine  40 mg Oral Daily  . DISCONTD: enoxaparin  30 mg Subcutaneous Q24H   Continuous Infusions:   . sodium chloride 75 mL/hr at 11/03/11 0128   PRN Meds:.acetaminophen, ALPRAZolam, ondansetron (ZOFRAN) IV, ondansetron Assessment/Plan: Patient Active Hospital Problem List: Hyponatremia (11/02/2011)   Assessment: I suspect the patient may have been dehydrated and may have a component of alcoholic chlamydia. Presently his sodium is back to normal after hydration. I will stop his IV fluids tonight and allow the patient to maintain his hydration without artificial means. We will recheck his electrolytes in the morning to ensure that he is able to maintain them.    Hypertension (07/24/2011)   Assessment: Patient's blood pressure has been under good control here in the hospital     Leukocytosis (11/02/2011)   Assessment: Resolved. My suspicion is that this is secondary to some hemoconcentration as well as a state of stress possibly associated with a recent seizure.    Alcohol abuse (11/02/2011)   Assessment: Reported and his primary care physician's documentation is that the patient is a previous alcohol user however the patient admits that he still has ongoing alcohol use although it does not appear to be habitual.   Anxiety  (11/02/2011)   Assessment: The patient has been on Xanax on a long-term basis and should not be without a Xanax due to the risk of withdrawal seizures. I've spoken with the patient about the importance of taking his medications as prescribed so that he is not out of them as he does have risk of seizure up to and including death with withdrawal from the benzodiazepines.       LOS: 1 day

## 2011-11-04 DIAGNOSIS — I10 Essential (primary) hypertension: Secondary | ICD-10-CM

## 2011-11-04 DIAGNOSIS — E871 Hypo-osmolality and hyponatremia: Secondary | ICD-10-CM

## 2011-11-04 DIAGNOSIS — F19939 Other psychoactive substance use, unspecified with withdrawal, unspecified: Secondary | ICD-10-CM

## 2011-11-04 LAB — BASIC METABOLIC PANEL
BUN: 16 mg/dL (ref 6–23)
CO2: 29 mEq/L (ref 19–32)
Chloride: 99 mEq/L (ref 96–112)
GFR calc Af Amer: >90 mL/min (ref >90)
Potassium: 3.9 mEq/L (ref 3.5–5.1)

## 2011-11-04 LAB — GLUCOSE, CAPILLARY: Glucose-Capillary: 80 mg/dL (ref 70–99)

## 2011-11-04 MED ORDER — ALPRAZOLAM 1 MG PO TABS: 1.0000 mg | ORAL_TABLET | Freq: Four times a day (QID) | ORAL | Status: DC | PRN

## 2011-11-04 NOTE — ED Provider Notes (Signed)
Medical screening examination/treatment/procedure(s) were conducted as a shared visit with non-physician practitioner(s) and myself.  I personally evaluated the patient during the encounter Pt with transient tingling right face. No facial droop. No weakness. No change in speech or vision. No ext numbness/weakness. Labs. Ct.   Suzi Roots, MD 11/04/11 [phone]

## 2011-11-04 NOTE — Progress Notes (Signed)
Case Manager consult placed related to difficulty obtaining medications per patient.  Notified son of discharge plans, son has no transportation this afternoon.  Will plan on discharge for tomorrow.

## 2011-11-04 NOTE — Discharge Summary (Addendum)
Melvin Krueger MRN: [id number] DOB/AGE: 1950-11-01 61 y.o.  Admit date: 11/02/2011 Discharge date: 11/04/2011  Primary Care Physician:  Elvina Sidle, MD, MD   Discharge Diagnoses:   Patient Active Problem List  Diagnoses  . Hypertension  . Hyponatremia  . Leukocytosis  . Alcohol abuse  . Anxiety    DISCHARGE MEDICATION: Medication List  As of 11/04/2011  4:47 PM   STOP taking these medications         BC FAST PAIN RELIEF ARTHRITIS [phone] MG Pack      ciprofloxacin 500 MG tablet         TAKE these medications         acetaminophen 500 MG tablet   Commonly known as: TYLENOL   Take 500 mg by mouth every 6 (six) hours as needed. Pain      ALPRAZolam 1 MG tablet   Commonly known as: XANAX   Take 1 tablet (1 mg total) by mouth 4 (four) times daily as needed. For anxiety      aspirin 81 MG tablet   Take 81 mg by mouth daily.      celecoxib 200 MG capsule   Commonly known as: CELEBREX   Take 1 capsule (200 mg total) by mouth 2 (two) times daily.      haloperidol 2 MG tablet   Commonly known as: HALDOL   Take 1 tablet (2 mg total) by mouth 2 (two) times daily.      lamoTRIgine 200 MG tablet   Commonly known as: LAMICTAL   Take 1 tablet (200 mg total) by mouth 2 (two) times daily.      metoprolol succinate 50 MG 24 hr tablet   Commonly known as: TOPROL-XL   Take 1 tablet (50 mg total) by mouth daily.      mulitivitamin with minerals Tabs   Take 1 tablet by mouth daily.      PARoxetine 40 MG tablet   Commonly known as: PAXIL   Take 1 tablet (40 mg total) by mouth every morning.              Consults:     SIGNIFICANT DIAGNOSTIC STUDIES:  Ct Head Wo Contrast  10/31/2011  *RADIOLOGY REPORT*  Clinical Data: Weakness.  Blurred vision on the right.  Dizziness.  CT HEAD WITHOUT CONTRAST  Technique:  Contiguous axial images were obtained from the base of the skull through the vertex without contrast.  Comparison: Head CT 06/10/2011 and multiple previous   Findings:  the brain shows generalized atrophy.  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  There is an old left occipital infarction.  No sign of acute infarction, mass lesion, hemorrhage, hydrocephalus or extra-axial collection.  The calvarium is unremarkable.  Sinuses, middle ears and mastoids are clear.  No abnormalities seen of the orbits or globes.  Impression: No change.  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  Original Report Authenticated By: Thomasenia Sales, M.D.      No results found for this or any previous visit (from the past 240 hour(s)).  BRIEF ADMITTING H & P: Pt is a 61 year old male with history of HTN, anxiety and alcohol abuse who presented to ED with acute mental status changes. On arrival to ED patient was able to provide history however at this time patient is too somnolent to participate in conversation. Per ED chart patient reported he was wondering around the house and could not remember all his medication. At this time patient is  stabilized hemodynamically in ED. We will obtain further history once more alert.    Hospital Course:  Present on Admission:  .Encephalopathy: Pt likely had a toxic-metabolic encephalopathy secondary to Benzodiazepine withdrawal and hyponatremia. The patient's mental status improved with hydration and administration of Xanax. It is likely that the patien tmay have had a seizure associated with Xanax withdrawal.  .Psychosocial: I spoke with Dr. Milus Glazier (pt's PMD) who reports that he has a concern for stolen medications in the home as the patient had a prescription issued on 10/17/2101 and was out of medications. When confronted, the patient reported that the prescription was never filled as his son refused to fill it. This has been the 2nd or 3rd time that patient "lost" his xanax by some means and been without his medication. For a patient who has had long term use of benzodiazepine he is at significant medical risk  with the discontinuation of the medication up to and including death. I have discussed this with the patient who is alert and oriented x 3 and advised that alternate placement such as ALF would be appropriate and he states that he plans to go home with his son and son's fiance.  .Hyponatremia: I suspect that the hyponatremia is secondary to dehydration and possibly ETOH intake. The patient was hydrated and had a resolution of hyponatremia. He was able to maintain normal sodium without further IVF.  Marland KitchenHypertension: BP well controlled on current medicaions  .Leukocytosis:Secondary to hemoconcentration. Resolved with hydration.  .Alcohol abuse: pt has been counseled against further ETOH use. Marland KitchenAnxiety: Pt will continue Xanax. I have prescribed 28 pills which are 1 weeks worth of medication and Dr. Milus Glazier will write his prescriptions on a weekly basis.  Disposition and Follow-up:  Patient is to followup with his primary care physician Dr. Milus Glazier in 1 week. He is being prescribed one week's worth of Xanax equaling 28 pills. Discharge Orders    Future Orders Please Complete By Expires   Diet - low sodium heart healthy      Increase activity slowly         DISCHARGE EXAM:  General: Alert, awake, oriented x3, in no acute distress.  Vital Signs:Blood pressure 113/65, pulse 56, temperature 98.2 F (36.8 C), temperature source Oral, resp. rate 18, height 5\' 9"  (1.753 m), weight 71.54 kg (157 lb 11.5 oz), SpO2 96.00%. HEENT: [place]/AT PEERL, EOMI  Neck: Trachea midline, no masses, no thyromegal,y no JVD, no carotid bruit  OROPHARYNX: Moist, No exudate/ erythema/lesions.  Heart: Regular rate and rhythm, without murmurs, rubs, gallops, PMI non-displaced, no heaves or thrills on palpation.  Lungs: Clear to auscultation, no wheezing or rhonchi noted.  Abdomen: Soft, nontender, nondistended, positive bowel sounds, no masses no hepatosplenomegaly noted. Musculoskeletal: No warm swelling or erythema  around joints, no spinal tenderness noted.  Psychiatric: Patient alert and oriented x3, good recent to remote recall.     Basename 11/04/11 0435 11/03/11 0500 11/02/11 1430  NA 138 136 --  K 3.9 4.2 --  CL 99 97 --  CO2 29 30 --  GLUCOSE 75 69* --  BUN 16 9 --  CREATININE 0.77 0.64 --  CALCIUM 8.8 9.3 --  MG -- -- 1.8  PHOS -- -- 2.6    Basename 11/03/11 0500 11/02/11 1430  AST 20 14  ALT 8 10  ALKPHOS 89 110  BILITOT 0.7 0.8  PROT 6.4 7.2  ALBUMIN 3.5 4.1   No results found for this basename: LIPASE:2,AMYLASE:2 in the last 72 hours  Basename 11/03/11 0500 11/02/11 1430 11/01/11 1700  WBC 6.8 11.4* --  NEUTROABS -- -- 11.5*  HGB 16.5 16.9 --  HCT 46.2 45.9 --  MCV 93.1 90.4 --  PLT 195 241 --    Total time for discharge process including face-to-face time approximately 40 minutes  Signed: Tamarick Kovalcik A. 11/04/2011, 4:47 PM   Pt presented to staff a prescription from 11/01/2011 issued by the ER for Xanax 1 mg qid PRN. I have discarded this prescription and substituted the prescription as noted above for 28 pills.

## 2011-11-05 ENCOUNTER — Other Ambulatory Visit: Payer: Self-pay | Admitting: Family Medicine

## 2011-11-05 LAB — GLUCOSE, CAPILLARY

## 2011-11-05 NOTE — Progress Notes (Signed)
Subjective: Pt was discharged to home on yesterday. However his family was unable to pick him up and SW was engaged to assist with getting him home. He has no complaints and reinforces that he intends to go home to his son and does not want alternate placement. Uneventful night. Objective: Filed Vitals:   11/04/11 1000 11/04/11 1405 11/04/11 2111 11/05/11 0548  BP: 126/70 113/65 145/71 140/64  Pulse: 68 56 59 60  Temp:  98.2 F (36.8 C) 97.5 F (36.4 C) 98.1 F (36.7 C)  TempSrc:  Oral Oral Oral  Resp:  18 18 18   Height:      Weight:    72.303 kg (159 lb 6.4 oz)  SpO2:  96% 97% 99%   Weight change: 1.633 kg (3 lb 9.6 oz)  Intake/Output Summary (Last 24 hours) at 11/05/11 1016 Last data filed at 11/05/11 0900  Gross per 24 hour  Intake    900 ml  Output    700 ml  Net    200 ml   General: Alert, awake, oriented x3, in no acute distress.  Vital Signs:Blood pressure 113/65, pulse 56, temperature 98.2 F (36.8 C), temperature source Oral, resp. rate 18, height 5\' 9"  (1.753 m), weight 71.54 kg (157 lb 11.5 oz), SpO2 96.00%.  HEENT: [place]/AT PEERL, EOMI  Neck: Trachea midline, no masses, no thyromegal,y no JVD, no carotid bruit  OROPHARYNX: Moist, No exudate/ erythema/lesions.  Heart: Regular rate and rhythm, without murmurs, rubs, gallops, PMI non-displaced, no heaves or thrills on palpation.  Lungs: Clear to auscultation, no wheezing or rhonchi noted.  Abdomen: Soft, nontender, nondistended, positive bowel sounds, no masses no hepatosplenomegaly noted.  Musculoskeletal: No warm swelling or erythema around joints, no spinal tenderness noted.  Psychiatric: Patient alert and oriented x3, good recent to remote recall.    Lab Results:  Basename 11/04/11 0435 11/03/11 0500 11/02/11 1430  NA 138 136 --  K 3.9 4.2 --  CL 99 97 --  CO2 29 30 --  GLUCOSE 75 69* --  BUN 16 9 --  CREATININE 0.77 0.64 --  CALCIUM 8.8 9.3 --  MG -- -- 1.8  PHOS -- -- 2.6    Basename 11/03/11 0500  11/02/11 1430  AST 20 14  ALT 8 10  ALKPHOS 89 110  BILITOT 0.7 0.8  PROT 6.4 7.2  ALBUMIN 3.5 4.1   No results found for this basename: LIPASE:2,AMYLASE:2 in the last 72 hours  Basename 11/03/11 0500 11/02/11 1430  WBC 6.8 11.4*  NEUTROABS -- --  HGB 16.5 16.9  HCT 46.2 45.9  MCV 93.1 90.4  PLT 195 241   No results found for this basename: CKTOTAL:3,CKMB:3,CKMBINDEX:3,TROPONINI:3 in the last 72 hours No components found with this basename: POCBNP:3 No results found for this basename: DDIMER:2 in the last 72 hours  Basename 11/02/11 1430  HGBA1C 5.5   No results found for this basename: CHOL:2,HDL:2,LDLCALC:2,TRIG:2,CHOLHDL:2,LDLDIRECT:2 in the last 72 hours  Basename 11/02/11 1430  TSH 0.964  T4TOTAL --  T3FREE --  THYROIDAB --   No results found for this basename: VITAMINB12:2,FOLATE:2,FERRITIN:2,TIBC:2,IRON:2,RETICCTPCT:2 in the last 72 hours  Micro Results: No results found for this or any previous visit (from the past 240 hour(s)).  Studies/Results: Ct Head Wo Contrast  10/31/2011  *RADIOLOGY REPORT*  Clinical Data: Weakness.  Blurred vision on the right.  Dizziness.  CT HEAD WITHOUT CONTRAST  Technique:  Contiguous axial images were obtained from the base of the skull through the vertex without contrast.  Comparison: Head  CT 06/10/2011 and multiple previous  Findings:  the brain shows generalized atrophy.  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  There is an old left occipital infarction.  No sign of acute infarction, mass lesion, hemorrhage, hydrocephalus or extra-axial collection.  The calvarium is unremarkable.  Sinuses, middle ears and mastoids are clear.  No abnormalities seen of the orbits or globes.  Impression: No change.  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  Original Report Authenticated By: Thomasenia Sales, M.D.    Medications: I have reviewed the patient's current medications. Scheduled Meds:   . aspirin  81 mg Oral Daily    . enoxaparin (LOVENOX) injection  40 mg Subcutaneous Q24H  . haloperidol  2 mg Oral BID  . lamoTRIgine  200 mg Oral BID  . metoprolol succinate  50 mg Oral Daily  . mulitivitamin with minerals  1 tablet Oral Daily  . PARoxetine  40 mg Oral Daily   Continuous Infusions:  PRN Meds:.acetaminophen, ALPRAZolam, ondansetron (ZOFRAN) IV, ondansetron Assessment/Plan: Patient Active Hospital Problem List: Plan to D/C to home  Please see D/C summary from yesterday.  LOS: 3 days

## 2011-11-09 ENCOUNTER — Telehealth: Payer: Self-pay

## 2011-11-18 ENCOUNTER — Encounter (HOSPITAL_COMMUNITY): Payer: Self-pay | Admitting: *Deleted

## 2011-11-18 ENCOUNTER — Observation Stay (HOSPITAL_COMMUNITY)
Admission: EM | Admit: 2011-11-18 | Discharge: 2011-11-22 | Disposition: A | Discharge status: Home / Self Care | Payer: Medicare Other | Attending: Internal Medicine | Admitting: Internal Medicine

## 2011-11-18 ENCOUNTER — Emergency Department (HOSPITAL_COMMUNITY): Payer: Medicare Other

## 2011-11-18 DIAGNOSIS — Z951 Presence of aortocoronary bypass graft: Secondary | ICD-10-CM | POA: Insufficient documentation

## 2011-11-18 DIAGNOSIS — F17200 Nicotine dependence, unspecified, uncomplicated: Secondary | ICD-10-CM | POA: Insufficient documentation

## 2011-11-18 DIAGNOSIS — R29898 Other symptoms and signs involving the musculoskeletal system: Secondary | ICD-10-CM | POA: Insufficient documentation

## 2011-11-18 DIAGNOSIS — E119 Type 2 diabetes mellitus without complications: Secondary | ICD-10-CM | POA: Insufficient documentation

## 2011-11-18 DIAGNOSIS — R0600 Dyspnea, unspecified: Secondary | ICD-10-CM | POA: Diagnosis present

## 2011-11-18 DIAGNOSIS — F1320 Sedative, hypnotic or anxiolytic dependence, uncomplicated: Secondary | ICD-10-CM | POA: Insufficient documentation

## 2011-11-18 DIAGNOSIS — J449 Chronic obstructive pulmonary disease, unspecified: Secondary | ICD-10-CM

## 2011-11-18 DIAGNOSIS — E871 Hypo-osmolality and hyponatremia: Secondary | ICD-10-CM

## 2011-11-18 DIAGNOSIS — I2510 Atherosclerotic heart disease of native coronary artery without angina pectoris: Secondary | ICD-10-CM | POA: Insufficient documentation

## 2011-11-18 DIAGNOSIS — R609 Edema, unspecified: Secondary | ICD-10-CM | POA: Insufficient documentation

## 2011-11-18 DIAGNOSIS — F22 Delusional disorders: Secondary | ICD-10-CM

## 2011-11-18 DIAGNOSIS — R600 Localized edema: Secondary | ICD-10-CM | POA: Diagnosis present

## 2011-11-18 DIAGNOSIS — R0609 Other forms of dyspnea: Principal | ICD-10-CM | POA: Insufficient documentation

## 2011-11-18 DIAGNOSIS — J441 Chronic obstructive pulmonary disease with (acute) exacerbation: Secondary | ICD-10-CM | POA: Insufficient documentation

## 2011-11-18 DIAGNOSIS — D72829 Elevated white blood cell count, unspecified: Secondary | ICD-10-CM

## 2011-11-18 DIAGNOSIS — I739 Peripheral vascular disease, unspecified: Secondary | ICD-10-CM | POA: Insufficient documentation

## 2011-11-18 DIAGNOSIS — R21 Rash and other nonspecific skin eruption: Secondary | ICD-10-CM | POA: Insufficient documentation

## 2011-11-18 DIAGNOSIS — Z79899 Other long term (current) drug therapy: Secondary | ICD-10-CM | POA: Insufficient documentation

## 2011-11-18 DIAGNOSIS — F1010 Alcohol abuse, uncomplicated: Secondary | ICD-10-CM | POA: Insufficient documentation

## 2011-11-18 DIAGNOSIS — R0989 Other specified symptoms and signs involving the circulatory and respiratory systems: Principal | ICD-10-CM | POA: Insufficient documentation

## 2011-11-18 DIAGNOSIS — F419 Anxiety disorder, unspecified: Secondary | ICD-10-CM

## 2011-11-18 DIAGNOSIS — I10 Essential (primary) hypertension: Secondary | ICD-10-CM | POA: Insufficient documentation

## 2011-11-18 DIAGNOSIS — I69998 Other sequelae following unspecified cerebrovascular disease: Secondary | ICD-10-CM | POA: Insufficient documentation

## 2011-11-18 HISTORY — DX: Peripheral vascular disease, unspecified: I73.9

## 2011-11-18 HISTORY — DX: Tobacco use: Z72.0

## 2011-11-18 LAB — DIFFERENTIAL
Basophils Relative: 0 % (ref 0–1)
Eosinophils Absolute: 0.1 10*3/uL (ref 0.0–0.7)
Eosinophils Relative: 1 % (ref 0–5)
Monocytes Absolute: 0.7 10*3/uL (ref 0.1–1.0)
Monocytes Relative: 7 % (ref 3–12)

## 2011-11-18 LAB — CBC
HCT: 47.7 % (ref 39.0–52.0)
Hemoglobin: 16.2 g/dL (ref 13.0–17.0)
MCH: 32.8 pg (ref 26.0–34.0)
MCHC: 34 g/dL (ref 30.0–36.0)
MCV: 96.6 fL (ref 78.0–100.0)

## 2011-11-18 LAB — COMPREHENSIVE METABOLIC PANEL
ALT: 9 U/L (ref 0–53)
AST: 14 U/L (ref 0–37)
CO2: 28 mEq/L (ref 19–32)
Calcium: 9.5 mg/dL (ref 8.4–10.5)
Potassium: 3.9 mEq/L (ref 3.5–5.1)
Sodium: 140 mEq/L (ref 135–145)
Total Protein: 6.8 g/dL (ref 6.0–8.3)

## 2011-11-18 MED ORDER — AZITHROMYCIN 250 MG PO TABS: 250.0000 mg | ORAL_TABLET | Freq: Every day | ORAL | Status: DC

## 2011-11-18 MED ORDER — AZITHROMYCIN 250 MG PO TABS
500.0000 mg | ORAL_TABLET | Freq: Once | ORAL | Status: AC
  Administered 2011-11-18: 500 mg via ORAL
  Filled 2011-11-18: qty 2

## 2011-11-18 MED ORDER — PREDNISONE 20 MG PO TABS
60.0000 mg | ORAL_TABLET | Freq: Once | ORAL | Status: AC
  Administered 2011-11-18: 60 mg via ORAL
  Filled 2011-11-18: qty 3

## 2011-11-18 MED ORDER — FUROSEMIDE 40 MG PO TABS: 40.0000 mg | ORAL_TABLET | Freq: Every day | ORAL | Status: DC

## 2011-11-18 MED ORDER — SODIUM CHLORIDE 0.9 % IV BOLUS (SEPSIS)
1000.0000 mL | Freq: Once | INTRAVENOUS | Status: AC
  Administered 2011-11-18: 1000 mL via INTRAVENOUS

## 2011-11-18 MED ORDER — PREDNISONE 10 MG PO TABS: 60.0000 mg | ORAL_TABLET | Freq: Every day | ORAL | Status: DC

## 2011-11-18 MED ORDER — FUROSEMIDE 10 MG/ML IJ SOLN
40.0000 mg | Freq: Once | INTRAMUSCULAR | Status: AC
  Administered 2011-11-18: 40 mg via INTRAVENOUS
  Filled 2011-11-18: qty 4

## 2011-11-18 NOTE — ED Notes (Signed)
Pt states that he has a motorized wheelchair at home that he is able to get around in. Pt also states "well I know the ambulance wont leave me at home if nobody is there and no one is home. I can take care of myself I can fix sandwiches."

## 2011-11-18 NOTE — ED Notes (Signed)
Attempted to call Darryl Valentina Lucks pts social worker. No answer. Dr Jeraldine Loots made aware. Will wait 30 min for return call. If no return call pt will be admitted due to unsafe living environment and inability to care for self at home.

## 2011-11-18 NOTE — ED Notes (Signed)
Attempted to call report to [address] [place]; RN unavailable currently, will call ED RN.

## 2011-11-18 NOTE — ED Notes (Signed)
Paged placed to pts dr in attempt to get pts daughter's phone number. No return call yet. Pt states if a family member would meet him at home then he will be able to take care of self and go home.

## 2011-11-18 NOTE — ED Provider Notes (Signed)
History     CSN: [id number]  Arrival date & time 11/18/11  1610   First MD Initiated Contact with Patient 11/18/11 1622      Chief Complaint  Patient presents with  . Shortness of Breath    (Consider location/radiation/quality/duration/timing/severity/associated sxs/prior treatment) HPI The patient presents with dyspnea.  He notes that over the past 3 weeks he's had increasing dyspnea, decreasing capacity to perform otherwise normal activities of daily living.  Over this timeframe he notes increasing lower extremity edema.  His symptoms began shortly after the patient was hospitalized for new seizures.  He states that since that presentation he has not had new medication.  He denies concurrent fever, chills, cough, chest pain, abdominal pain, nausea, vomiting, diarrhea. Today, as his dyspnea worsened, he actually felt prodromal sensation similar to that he felt prior to his seizure.  He did not have a seizure but but given his sensation he wanted to be evaluated. Past Medical History  Diagnosis Date  . Coronary artery disease   . Hypertension   . Diabetes mellitus   . Stroke   . Anxiety   . Substance abuse   . Alcohol abuse     Past Surgical History  Procedure Date  . Cardiac surgery     CABG  . Carotid endarterectomy     left side  . Heart stents     No family history on file.  History  Substance Use Topics  . Smoking status: Current Everyday Smoker  . Smokeless tobacco: Never Used  . Alcohol Use: Yes     daily      Review of Systems  Constitutional:       Per HPI, otherwise negative  HENT:       Per HPI, otherwise negative  Eyes: Negative.   Respiratory:       Per HPI, otherwise negative  Cardiovascular:       Per HPI, otherwise negative  Gastrointestinal: Negative for vomiting.  Genitourinary: Negative.   Musculoskeletal:       Per HPI, otherwise negative  Skin: Negative.   Neurological: Negative for syncope.    Allergies  Penicillins  Home  Medications   Current Outpatient Rx  Name Route Sig Dispense Refill  . ACETAMINOPHEN 500 MG PO TABS Oral Take 500 mg by mouth every 6 (six) hours as needed. Pain    . ALPRAZOLAM 1 MG PO TABS Oral Take 1 tablet (1 mg total) by mouth 4 (four) times daily as needed. For anxiety 28 tablet 0  . ASPIRIN 81 MG PO TABS Oral Take 81 mg by mouth daily.    . CELECOXIB 200 MG PO CAPS Oral Take 1 capsule (200 mg total) by mouth 2 (two) times daily. 30 capsule 2  . HALOPERIDOL 2 MG PO TABS Oral Take 1 tablet (2 mg total) by mouth 2 (two) times daily. 30 tablet 5  . LAMOTRIGINE 200 MG PO TABS Oral Take 1 tablet (200 mg total) by mouth 2 (two) times daily. 60 tablet 11  . METOPROLOL SUCCINATE ER 50 MG PO TB24 Oral Take 1 tablet (50 mg total) by mouth daily. 90 tablet 3  . ADULT MULTIVITAMIN W/MINERALS CH Oral Take 1 tablet by mouth daily.      Marland Kitchen PAROXETINE HCL 40 MG PO TABS Oral Take 1 tablet (40 mg total) by mouth every morning. 30 tablet 11    BP 119/52  Pulse 86  Temp 98.7 F (37.1 C) (Oral)  Resp 18  SpO2 100%  Physical Exam  Nursing note and vitals reviewed. Constitutional: He is oriented to person, place, and time. He appears well-developed. No distress.  HENT:  Head: Normocephalic and atraumatic.  Eyes: Conjunctivae and EOM are normal.  Cardiovascular: Normal rate and regular rhythm.   Pulmonary/Chest: Effort normal. No stridor. No respiratory distress.  Abdominal: He exhibits no distension.  Musculoskeletal: He exhibits edema and tenderness.       Minimally tender bilateral lower extremity edema 2+  Neurological: He is alert and oriented to person, place, and time.  Skin: Skin is warm and dry.       Cigarette stains visible on digits  Psychiatric: He has a normal mood and affect.    ED Course  Procedures (including critical care time)   Labs Reviewed  COMPREHENSIVE METABOLIC PANEL  CBC  DIFFERENTIAL  PRO B NATRIURETIC PEPTIDE   Dg Chest 2 View  11/18/2011  *RADIOLOGY REPORT*   Clinical Data: Weakness, shortness of breath  CHEST - 2 VIEW  Comparison: 06/07/2011  Findings: Cardiomediastinal silhouette is stable.  Status post CABG again noted.  No acute infiltrate or pleural effusion.  No pulmonary edema.  Mild elevation of the left hemidiaphragm again noted.  IMPRESSION: No active disease.  No significant change.  Original Report Authenticated By: Natasha Mead, M.D.     No diagnosis found.  Pulse oximetry 100% room air   Date: 11/18/2011  Rate: 84  Rhythm: normal sinus rhythm  QRS Axis: right  Intervals: normal  ST/T Wave abnormalities: nonspecific T wave changes  Conduction Disutrbances:none  Narrative Interpretation:   Old EKG Reviewed: none available No sig changes  MDM  This 61 -year-old male now presents with weeks of dyspnea and cough.  On exam the patient is in no distress.  Notably, the patient has LE edema symmetrically, and a distant cardiac history.  Given the patient's long smoking history, his description of mild dyspnea, without acute changes there suspicion for undiagnosed emphysema.  The patient's LE edema may also caused by CHF, though a ECHO in 2009 was essentially normal.  With the chronicity of Sx, there is low suspicion of ongoing ischemia (cardiac).  This presentation is likely multi-factorial, and I discussed all findings and thoughts with the patient. The patient was admitted for further E/M.    Gerhard Munch, MD 11/18/11 2258

## 2011-11-18 NOTE — ED Notes (Signed)
report called to [address]. Pt resting in bed; no s/s of distress noted.

## 2011-11-18 NOTE — ED Notes (Signed)
Pt present to room 20 via EMS with C/O SOB.  Pt originally called EMS because he thought he was going to have a seizure.  Pt aaox3.

## 2011-11-18 NOTE — ED Notes (Signed)
[id number]<QA> Expected date:<BR> Expected time: 4:02 PM<BR> Means of arrival:<BR> Comments:<BR> M20 - 61yoM SOB, *TRIAGE-CAPABLE*

## 2011-11-18 NOTE — ED Notes (Signed)
No return call from pts Child psychotherapist. EDP notified.

## 2011-11-18 NOTE — ED Notes (Signed)
DSS arrived while EMS was on scene.  Pt was home alone upon arival.  Per DSS, Patient was supposed to have a caretaker.  No caretaker present. [facility] DSS wants to be notified about patient status.Renelda Mom, Social Work [phone], Fax [phone]

## 2011-11-19 ENCOUNTER — Encounter (HOSPITAL_COMMUNITY): Payer: Self-pay | Admitting: Internal Medicine

## 2011-11-19 DIAGNOSIS — R0602 Shortness of breath: Secondary | ICD-10-CM

## 2011-11-19 DIAGNOSIS — R0600 Dyspnea, unspecified: Secondary | ICD-10-CM | POA: Diagnosis present

## 2011-11-19 DIAGNOSIS — R600 Localized edema: Secondary | ICD-10-CM | POA: Diagnosis present

## 2011-11-19 DIAGNOSIS — J449 Chronic obstructive pulmonary disease, unspecified: Secondary | ICD-10-CM | POA: Diagnosis present

## 2011-11-19 DIAGNOSIS — M7989 Other specified soft tissue disorders: Secondary | ICD-10-CM

## 2011-11-19 DIAGNOSIS — T7411XA Adult physical abuse, confirmed, initial encounter: Secondary | ICD-10-CM

## 2011-11-19 DIAGNOSIS — F1010 Alcohol abuse, uncomplicated: Secondary | ICD-10-CM

## 2011-11-19 DIAGNOSIS — Z951 Presence of aortocoronary bypass graft: Secondary | ICD-10-CM

## 2011-11-19 LAB — URINALYSIS, ROUTINE W REFLEX MICROSCOPIC
Bilirubin Urine: NEGATIVE
Glucose, UA: NEGATIVE mg/dL
Ketones, ur: NEGATIVE mg/dL
pH: 6 (ref 5.0–8.0)

## 2011-11-19 MED ORDER — FOLIC ACID 1 MG PO TABS
1.0000 mg | ORAL_TABLET | Freq: Every day | ORAL | Status: DC
  Administered 2011-11-19 – 2011-11-22 (×4): 1 mg via ORAL
  Filled 2011-11-19 (×4): qty 1

## 2011-11-19 MED ORDER — ONDANSETRON HCL 4 MG/2ML IJ SOLN: 4.0000 mg | Freq: Four times a day (QID) | INTRAMUSCULAR | Status: DC | PRN

## 2011-11-19 MED ORDER — ACETAMINOPHEN 650 MG RE SUPP: 650.0000 mg | Freq: Four times a day (QID) | RECTAL | Status: DC | PRN

## 2011-11-19 MED ORDER — SENNA 8.6 MG PO TABS
1.0000 | ORAL_TABLET | Freq: Two times a day (BID) | ORAL | Status: DC
  Administered 2011-11-19 – 2011-11-22 (×6): 8.6 mg via ORAL
  Filled 2011-11-19 (×6): qty 1

## 2011-11-19 MED ORDER — SODIUM CHLORIDE 0.9 % IJ SOLN
3.0000 mL | Freq: Two times a day (BID) | INTRAMUSCULAR | Status: DC
  Administered 2011-11-19 – 2011-11-22 (×8): 3 mL via INTRAVENOUS

## 2011-11-19 MED ORDER — NICOTINE 21 MG/24HR TD PT24
21.0000 mg | MEDICATED_PATCH | Freq: Every day | TRANSDERMAL | Status: DC
  Administered 2011-11-19 – 2011-11-22 (×4): 21 mg via TRANSDERMAL
  Filled 2011-11-19 (×4): qty 1

## 2011-11-19 MED ORDER — ADULT MULTIVITAMIN W/MINERALS CH
1.0000 | ORAL_TABLET | Freq: Every day | ORAL | Status: DC
  Administered 2011-11-19 – 2011-11-22 (×4): 1 via ORAL
  Filled 2011-11-19 (×4): qty 1

## 2011-11-19 MED ORDER — ALPRAZOLAM 1 MG PO TABS
1.0000 mg | ORAL_TABLET | Freq: Four times a day (QID) | ORAL | Status: DC | PRN
  Administered 2011-11-19 – 2011-11-22 (×11): 1 mg via ORAL
  Filled 2011-11-19 (×11): qty 1

## 2011-11-19 MED ORDER — LAMOTRIGINE 200 MG PO TABS
200.0000 mg | ORAL_TABLET | Freq: Two times a day (BID) | ORAL | Status: DC
  Administered 2011-11-19 – 2011-11-22 (×8): 200 mg via ORAL
  Filled 2011-11-19 (×9): qty 1

## 2011-11-19 MED ORDER — POLYETHYLENE GLYCOL 3350 17 G PO PACK
17.0000 g | PACK | Freq: Every day | ORAL | Status: DC | PRN
  Filled 2011-11-19: qty 1

## 2011-11-19 MED ORDER — HEPARIN SODIUM (PORCINE) 5000 UNIT/ML IJ SOLN
5000.0000 [IU] | Freq: Three times a day (TID) | INTRAMUSCULAR | Status: DC
  Administered 2011-11-19 – 2011-11-22 (×10): 5000 [IU] via SUBCUTANEOUS
  Filled 2011-11-19 (×13): qty 1

## 2011-11-19 MED ORDER — IPRATROPIUM BROMIDE 0.02 % IN SOLN
0.5000 mg | Freq: Four times a day (QID) | RESPIRATORY_TRACT | Status: DC
  Administered 2011-11-19 – 2011-11-22 (×13): 0.5 mg via RESPIRATORY_TRACT
  Filled 2011-11-19 (×14): qty 2.5

## 2011-11-19 MED ORDER — ALBUTEROL SULFATE (5 MG/ML) 0.5% IN NEBU: 2.5000 mg | INHALATION_SOLUTION | RESPIRATORY_TRACT | Status: DC | PRN

## 2011-11-19 MED ORDER — LORAZEPAM 1 MG PO TABS
1.0000 mg | ORAL_TABLET | Freq: Four times a day (QID) | ORAL | Status: AC | PRN
  Administered 2011-11-19: 1 mg via ORAL
  Filled 2011-11-19: qty 1

## 2011-11-19 MED ORDER — HALOPERIDOL 2 MG PO TABS
2.0000 mg | ORAL_TABLET | Freq: Two times a day (BID) | ORAL | Status: DC
  Administered 2011-11-19 – 2011-11-22 (×7): 2 mg via ORAL
  Filled 2011-11-19 (×9): qty 1

## 2011-11-19 MED ORDER — PAROXETINE HCL 20 MG PO TABS
40.0000 mg | ORAL_TABLET | Freq: Every day | ORAL | Status: DC
  Administered 2011-11-19 – 2011-11-22 (×4): 40 mg via ORAL
  Filled 2011-11-19 (×4): qty 2

## 2011-11-19 MED ORDER — ALBUTEROL SULFATE (5 MG/ML) 0.5% IN NEBU
2.5000 mg | INHALATION_SOLUTION | Freq: Four times a day (QID) | RESPIRATORY_TRACT | Status: DC
  Administered 2011-11-19 – 2011-11-22 (×13): 2.5 mg via RESPIRATORY_TRACT
  Filled 2011-11-19 (×14): qty 0.5

## 2011-11-19 MED ORDER — DOCUSATE SODIUM 100 MG PO CAPS
100.0000 mg | ORAL_CAPSULE | Freq: Two times a day (BID) | ORAL | Status: DC
  Administered 2011-11-19 – 2011-11-22 (×7): 100 mg via ORAL
  Filled 2011-11-19 (×8): qty 1

## 2011-11-19 MED ORDER — VITAMIN B-1 100 MG PO TABS
100.0000 mg | ORAL_TABLET | Freq: Every day | ORAL | Status: DC
  Administered 2011-11-19 – 2011-11-22 (×4): 100 mg via ORAL
  Filled 2011-11-19 (×4): qty 1

## 2011-11-19 MED ORDER — ONDANSETRON HCL 4 MG PO TABS: 4.0000 mg | ORAL_TABLET | Freq: Four times a day (QID) | ORAL | Status: DC | PRN

## 2011-11-19 MED ORDER — LORAZEPAM 2 MG/ML IJ SOLN: 1.0000 mg | Freq: Four times a day (QID) | INTRAMUSCULAR | Status: AC | PRN

## 2011-11-19 MED ORDER — NYSTATIN-TRIAMCINOLONE 100000-0.1 UNIT/GM-% EX OINT
TOPICAL_OINTMENT | Freq: Two times a day (BID) | CUTANEOUS | Status: DC
  Administered 2011-11-19 – 2011-11-22 (×8): via TOPICAL
  Filled 2011-11-19: qty 15

## 2011-11-19 MED ORDER — SODIUM CHLORIDE 0.9 % IV SOLN
250.0000 mL | INTRAVENOUS | Status: DC | PRN
  Administered 2011-11-19: 250 mL via INTRAVENOUS

## 2011-11-19 MED ORDER — AZITHROMYCIN 250 MG PO TABS
250.0000 mg | ORAL_TABLET | Freq: Every day | ORAL | Status: DC
  Administered 2011-11-19 – 2011-11-22 (×4): 250 mg via ORAL
  Filled 2011-11-19 (×4): qty 1

## 2011-11-19 MED ORDER — PREDNISONE 10 MG PO TABS
10.0000 mg | ORAL_TABLET | Freq: Every day | ORAL | Status: DC
  Administered 2011-11-19 – 2011-11-22 (×4): 10 mg via ORAL
  Filled 2011-11-19 (×6): qty 1

## 2011-11-19 MED ORDER — FUROSEMIDE 10 MG/ML IJ SOLN
20.0000 mg | Freq: Two times a day (BID) | INTRAMUSCULAR | Status: AC
  Administered 2011-11-19 (×2): 20 mg via INTRAVENOUS
  Filled 2011-11-19 (×2): qty 2

## 2011-11-19 MED ORDER — ACETAMINOPHEN 325 MG PO TABS
650.0000 mg | ORAL_TABLET | Freq: Four times a day (QID) | ORAL | Status: DC | PRN
Start: 2011-11-19 — End: 2011-11-22
  Administered 2011-11-22: 650 mg via ORAL
  Filled 2011-11-19: qty 2

## 2011-11-19 MED ORDER — THIAMINE HCL 100 MG/ML IJ SOLN
100.0000 mg | Freq: Every day | INTRAMUSCULAR | Status: DC
  Filled 2011-11-19 (×4): qty 1

## 2011-11-19 MED ORDER — ASPIRIN EC 81 MG PO TBEC
81.0000 mg | DELAYED_RELEASE_TABLET | Freq: Every day | ORAL | Status: DC
  Administered 2011-11-19 – 2011-11-22 (×4): 81 mg via ORAL
  Filled 2011-11-19 (×4): qty 1

## 2011-11-19 MED ORDER — SODIUM CHLORIDE 0.9 % IJ SOLN
3.0000 mL | Freq: Two times a day (BID) | INTRAMUSCULAR | Status: DC
  Administered 2011-11-19 – 2011-11-22 (×7): 3 mL via INTRAVENOUS

## 2011-11-19 MED ORDER — METOPROLOL SUCCINATE ER 50 MG PO TB24
50.0000 mg | ORAL_TABLET | Freq: Every day | ORAL | Status: DC
  Administered 2011-11-19 – 2011-11-22 (×4): 50 mg via ORAL
  Filled 2011-11-19 (×4): qty 1

## 2011-11-19 MED ORDER — SODIUM CHLORIDE 0.9 % IJ SOLN: 3.0000 mL | INTRAMUSCULAR | Status: DC | PRN
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
## 2011-11-19 NOTE — Care Management Note (Signed)
    Page 1 of 2   11/22/2011     1:31:55 PM   CARE MANAGEMENT NOTE 11/22/2011  Patient:  Melvin Krueger, Melvin Krueger   Account Number:  [id number]  Date Initiated:  11/19/2011  Documentation initiated by:  Lanier Clam  Subjective/Objective Assessment:   ADMITTED W/SOB.ZO:XWRU.     Action/Plan:   FROM  HOME.SOCIAL ISSUES-PER SW APS REFERRED TO HOME ON PRIOR ADMISSION.   Anticipated DC Date:  11/22/2011   Anticipated DC Plan:  HOME W HOME HEALTH SERVICES  In-house referral  Clinical Social Worker      DC Planning Services  CM consult      Choice offered to / List presented to:  C-1 Patient   DME arranged  BEDSIDE COMMODE      DME agency  [facility]     HH arranged  HH-1 RN  HH-2 PT  HH-3 OT  HH-4 NURSE'S AIDE  HH-6 SOCIAL WORKER      HH agency  [facility]   Status of service:  Completed, signed off Medicare Important Message given?   (If response is "NO", the following Medicare IM given date fields will be blank) Date Medicare IM given:   Date Additional Medicare IM given:    Discharge Disposition:  HOME W HOME HEALTH SERVICES  Per UR Regulation:  Reviewed for med. necessity/level of care/duration of stay  If discussed at Long Length of Stay Meetings, dates discussed:    Comments:  11/22/11 Geroge Gilliam RN,BSN NCM [phone] PT-SNF.PATIENT DECLINED SNF.HAS CAPACITY.[facility] CHOSEN FOR HH.CONTACTED SUSAN(LIASON) ABOUT HH,DME,& D/C.SW CONTACTED FOR TRANSP.  11/19/11 Aneisa Karren RN,BSN NCM [phone] PT-24HR SUPV.PRIVATE SITTER LIST GIVEN.

## 2011-11-19 NOTE — Evaluation (Signed)
Physical Therapy Evaluation Patient Details Name: RAMIR MALERBA MRN: [id number] DOB: 06-11-1950 Today's Date: 11/19/2011 Time: 7829-5621 PT Time Calculation (min): 15 min  PT Assessment / Plan / Recommendation Clinical Impression  61 yo male admitted with dyspnea. O2 sats during session: 95% on RA. No SOB noted. Pt states he plans to d/c home, but that no one is home with him at this time-states he can manage. Feel pt needs some assist/supervision at home for safety with mobility/ADLs. Pt is likely at his baseline for mobility-little to no ambulation due to fall risk and transfers only. Will assess pt's ability to transfer from bed>chair  safely on next visit. Recommend OT consult to assess pt's ability to perform ADLs safely.    PT Assessment  Patient needs continued PT services    Follow Up Recommendations  Supervision/Assistance - 24 hour. (Home health PT depending on progress).   Barriers to Discharge Decreased caregiver support      lEquipment Recommendations  None recommended by PT    Recommendations for Other Services OT consult   Frequency Min 3X/week    Precautions / Restrictions Precautions Precautions: Fall Precaution Comments: chronic weakness R side from previous stroke Restrictions Weight Bearing Restrictions: No   Pertinent Vitals/Pain       Mobility  Bed Mobility Bed Mobility: Supine to Sit;Sit to Supine Supine to Sit: 4: Min assist Sit to Supine: 4: Min assist Details for Bed Mobility Assistance: A for R LE onto/off bed.  Transfers Transfers: Sit to Stand;Stand to Sit Sit to Stand: 4: Min assist Stand to Sit: 4: Min assist Details for Transfer Assistance: VCs safety, technique, hand placement. Assist to rise, stabilize, place and maintain R hand on walkerr handle Ambulation/Gait Ambulation/Gait Assistance: 4: Min assist Ambulation Distance (Feet): 80 Feet Assistive device: Rolling walker Ambulation/Gait Assistance Details: VCs safety. Assist to  stabilize throughout ambulaiton, maintain grip on walker with R hand. Some R LE ataxia noted.  Gait Pattern: Step-through pattern;Ataxic;Decreased stride length    Exercises     PT Diagnosis: Difficulty walking;Abnormality of gait;Hemiplegia dominant side  PT Problem List: Decreased mobility;Decreased knowledge of use of DME;Decreased coordination;Decreased strength;Decreased balance;Impaired sensation PT Treatment Interventions: Therapeutic activities;Patient/family education;Functional mobility training;Gait training   PT Goals Acute Rehab PT Goals PT Goal Formulation: With patient Time For Goal Achievement: 12/03/11 Potential to Achieve Goals: Good Pt will go Supine/Side to Sit: with supervision PT Goal: Supine/Side to Sit - Progress: Goal set today Pt will go Sit to Supine/Side: with supervision PT Goal: Sit to Supine/Side - Progress: Goal set today Pt will go Sit to Stand: with supervision PT Goal: Sit to Stand - Progress: Goal set today Pt will Transfer Bed to Chair/Chair to Bed: with supervision PT Transfer Goal: Bed to Chair/Chair to Bed - Progress: Goal set today Pt will Ambulate: 51 - 150 feet;with min assist;with rolling walker PT Goal: Ambulate - Progress: Goal set today  Visit Information  Last PT Received On: 11/19/11 Assistance Needed: +1    Subjective Data  Subjective: "I'm tired of telling everyboday the same thing.Marland KitchenMarland KitchenMarland KitchenI had a stroke on my R side" Patient Stated Goal: Home   Prior Functioning  Home Living Lives With: Son (lives with son usually, but pt says no one is there now???) Type of Home: Apartment Home Access: Level entry Home Layout: One level Home Adaptive Equipment: Wheelchair - powered;Walker - rolling Prior Function Level of Independence: Needs assistance Needs Assistance: Dressing;Transfers Transfer Assistance: son occasionally assist with dressing, transfers. scoot/squat-pivot transfers only, at  baseline due to high fall  risk. Communication Communication: No difficulties    Cognition  Overall Cognitive Status: Appears within functional limits for tasks assessed/performed Arousal/Alertness: Awake/alert Orientation Level: Appears intact for tasks assessed Behavior During Session: [facility] for tasks performed    Extremity/Trunk Assessment Left Upper Extremity Assessment LUE ROM/Strength/Tone: Deficits LUE ROM/Strength/Tone Deficits: lacks functional use of UE, poor grip Right Lower Extremity Assessment RLE ROM/Strength/Tone: Deficits RLE ROM/Strength/Tone Deficits: knee ext 3+/5, DF/PF 3-/5 RLE Sensation: Deficits RLE Sensation Deficits: Diminished light touch, proprioception R LE RLE Coordination Deficits: R LE ataxia-poor control during gait. Left Lower Extremity Assessment LLE ROM/Strength/Tone: WFL for tasks assessed LLE Coordination: WFL - gross motor   Balance    End of Session PT - End of Session Equipment Utilized During Treatment: Gait belt Activity Tolerance: Patient tolerated treatment well Patient left: in chair;with call bell/phone within reach   Rebeca Alert [facility] 11/19/2011, 12:17 PM [phone]

## 2011-11-19 NOTE — Evaluation (Signed)
Occupational Therapy Evaluation Patient Details Name: Melvin Krueger MRN: [id number] DOB: 1951/01/28 Today's Date: 11/19/2011 Time: 7829-5621 OT Time Calculation (min): 20 min  OT Assessment / Plan / Recommendation Clinical Impression  This 61 year old man was admitted with dyspnea.  He has a h/o CVA affecting R side.  At baseline, he reports being mod I getting on/off scooter and performs ADLs.  He is currently at a min A level for transfers and LB ADLs due to balance.  He is appropriate for skilled OT with supervision level goals    OT Assessment  Patient needs continued OT Services    Follow Up Recommendations  Supervision/Assistance - 24 hour; HHOT depending on progress    Barriers to Discharge      Equipment Recommendations  3 in 1 bedside comode;None recommended by PT (Pt states apt is planning to put high commode in)    Recommendations for Other Services    Frequency  Min 2X/week    Precautions / Restrictions Precautions Precautions: Fall Precaution Comments: chronic weakness R side from previous stroke Restrictions Weight Bearing Restrictions: No   Pertinent Vitals/Pain No pain, no dyspnea    ADL  Eating/Feeding: Simulated;Set up Where Assessed - Eating/Feeding: Bed level Grooming: Simulated;Set up Where Assessed - Grooming: Unsupported sitting Upper Body Bathing: Simulated;Set up Where Assessed - Upper Body Bathing: Unsupported sitting Lower Body Bathing: Simulated;Minimal assistance Where Assessed - Lower Body Bathing: Unsupported sit to stand Upper Body Dressing: Simulated;Set up Where Assessed - Upper Body Dressing: Unsupported sitting Lower Body Dressing: Performed;Set up (slip on shoes; min a for balance for pants) Where Assessed - Lower Body Dressing: Unsupported sit to stand Toilet Transfer: Simulated;Minimal assistance (bed to chair to bed) Toilet Transfer Method: Stand pivot Toileting - Clothing Manipulation and Hygiene: Simulated;Minimal  assistance Where Assessed - Toileting Clothing Manipulation and Hygiene: Standing Transfers/Ambulation Related to ADLs: min a for transfer to chair/3:1-states scooter is easier at home.  May try w/c next time ADL Comments: min A for balance    OT Diagnosis: Generalized weakness  OT Problem List: Decreased strength;Decreased activity tolerance;Impaired balance (sitting and/or standing);Decreased knowledge of use of DME or AE OT Treatment Interventions: Self-care/ADL training;Patient/family education;Therapeutic activities;Balance training (?walker splint, if PT agrees and if walker brought in)   OT Goals Acute Rehab OT Goals OT Goal Formulation: With patient Time For Goal Achievement: 12/03/11 Potential to Achieve Goals: Good ADL Goals Pt Will Transfer to Toilet: with supervision;Stand pivot transfer;3-in-1 ADL Goal: Toilet Transfer - Progress: Goal set today Miscellaneous OT Goals Miscellaneous OT Goal #1: Pt will be supervision level for LB adls and toileting:  sit to stand  Visit Information  Last OT Received On: 11/19/11 Assistance Needed: +1    Subjective Data  Subjective: "what do you want me to do" Patient Stated Goal: get home.  I didn't have a ride today   Prior Functioning  Home Living Lives With: Son Type of Home: Apartment Home Access: Level entry Home Layout: One level Bathroom Shower/Tub: Tub/shower unit (bars clamp on) Firefighter: Standard Home Adaptive Equipment: Wheelchair - powered;Walker - rolling Prior Function Level of Independence:  (occasional assistance) Needs Assistance: Transfers Transfer Assistance: son occasionally assist with dressing, transfers. scoot/squat-pivot transfers only, at baseline due to high fall risk. Communication Communication: No difficulties Dominant Hand: Left    Cognition  Overall Cognitive Status: Appears within functional limits for tasks assessed/performed Behavior During Session: [facility] for tasks performed     Extremity/Trunk Assessment Right Upper Extremity Assessment RUE ROM/Strength/Tone:  Deficits RUE ROM/Strength/Tone Deficits: movement wfls; poor grip; poor sensation Left Upper Extremity Assessment LUE ROM/Strength/Tone: WFL for tasks assessed   Mobility Bed Mobility Supine to Sit: 4: Min assist Transfers Sit to Stand: 4: Min assist   Exercise    Balance Balance Balance Assessed: Yes Static Standing Balance Static Standing - Balance Support: No upper extremity supported (min A)  End of Session OT - End of Session Activity Tolerance: Patient tolerated treatment well Patient left: in bed;with call bell/phone within reach   [facility] 11/19/2011, 4:49 PM Marica Otter, OTR/L [phone] 11/19/2011

## 2011-11-19 NOTE — Progress Notes (Addendum)
Patient admitted early this AM by Dr. Kaylyn Layer.  Appears to be a COPD exacerbation as well as some social issues- will ask social services to follow. I do not think patient has capacity to make his own medical decisions but will ask psych to give an opinion as well.  Melvin Krueger
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
## 2011-11-20 DIAGNOSIS — R0602 Shortness of breath: Secondary | ICD-10-CM

## 2011-11-20 DIAGNOSIS — T7411XA Adult physical abuse, confirmed, initial encounter: Secondary | ICD-10-CM

## 2011-11-20 DIAGNOSIS — F1010 Alcohol abuse, uncomplicated: Secondary | ICD-10-CM

## 2011-11-20 DIAGNOSIS — F329 Major depressive disorder, single episode, unspecified: Secondary | ICD-10-CM

## 2011-11-20 DIAGNOSIS — M7989 Other specified soft tissue disorders: Secondary | ICD-10-CM

## 2011-11-20 LAB — GLUCOSE, CAPILLARY
Glucose-Capillary: 184 mg/dL — ABNORMAL HIGH (ref 70–99)
Glucose-Capillary: 256 mg/dL — ABNORMAL HIGH (ref 70–99)
Glucose-Capillary: 154 mg/dL — ABNORMAL HIGH (ref 70–99)
Glucose-Capillary: 163 mg/dL — ABNORMAL HIGH (ref 70–99)

## 2011-11-20 MED ORDER — FOOD THICKENER (THICKENUP CLEAR)
ORAL | Status: DC | PRN
  Filled 2011-11-20: qty 120
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
## 2011-11-20 NOTE — Progress Notes (Addendum)
Speech Pathology: Note:  Order received for swallow eval, spoke to RN.  Reviewed pt hx with RN and multiple risk factors present for dysphagia.    Will complete BSE on Monday 6/17 as ordered.  In interim time, order can of thickener for prn use - if prevents pt from coughing with liquids.  Spoke to RN Wadie Lessen who reports being comfortable with plan.   Thanks for order.  Melvin Burnet, MS [facility] SLP [phone]

## 2011-11-20 NOTE — Progress Notes (Addendum)
Occupational Therapy Treatment Patient Details Name: Melvin Krueger MRN: [id number] DOB: February 15, 1951 Today's Date: 11/20/2011 Time: 5784-6962 OT Time Calculation (min): 24 min  OT Assessment / Plan / Recommendation Comments on Treatment Session Tolerated treatment well. Did add goal for AE with feeding. Need to continue to work on safety with functional transfers.     Follow Up Recommendations  Home health OT;Supervision/Assistance - 24 hour    Barriers to Discharge       Equipment Recommendations  3 in 1 bedside comode;None recommended by PT    Recommendations for Other Services    Frequency Min 2X/week   Plan Discharge plan remains appropriate    Precautions / Restrictions Precautions Precautions: Fall  Chronic weakness R side from old stroke        ADL  Toilet Transfer: Simulated;Minimal assistance Toilet Transfer Method: Other (comment) (bed to chair with RW. see below) ADL Comments: Attempted pivot to recliner without assistive device but pt states he is too nervous right now, preferred to hold to RW. Pt usually pivots to his scooter at home without assistive device. Required assist to stabilize/maintain grip on RW on R side. May benefit from walker hand grip. Nursing reports that patient spilled much of his breakfast on him this am  when trying to feed himself with either right or left hand. Spoke to patient and he states that he usually feeds himself with his left hand even though he is right handed due to weak grasp on right. But states it is difficult to feed himself with left hand also because of how shaky he is. Issued foam grip and demonstrated use. Informed nursing who will try it with his evening meal with him. Added goal.     OT Diagnosis:    OT Problem List:   OT Treatment Interventions:     OT Goals ADL Goals ADL Goal: Toilet Transfer - Progress: Progressing toward goals Miscellaneous OT Goals Miscellaneous OT Goal #2: added 11/20/11 Pt will  verbalize/demonstrate independence with use of AE for utensils to self feed with decrease spillage.  OT Goal: Miscellaneous Goal #2 - Progress: Goal set today  Visit Information  Last OT Received On: 11/20/11 Assistance Needed: +1    Subjective Data  Subjective: I dropped alot of my food Patient Stated Goal: agreeable to get up with OT. none stated   Prior Functioning       Cognition  Overall Cognitive Status: Appears within functional limits for tasks assessed/performed Arousal/Alertness: Awake/alert Orientation Level: Appears intact for tasks assessed Behavior During Session: [facility] for tasks performed    Mobility Bed Mobility Bed Mobility: Supine to Sit Supine to Sit: 4: Min assist;3: Mod assist;With rails Transfers Transfers: Sit to Stand;Stand to Sit Sit to Stand: 4: Min assist;With upper extremity assist;From bed Stand to Sit: 4: Min assist;With upper extremity assist;To chair/3-in-1 Details for Transfer Assistance: verbal cues for hand placement and assist to come to standing and balance.    Exercises    Balance    End of Session OT - End of Session Equipment Utilized During Treatment: Gait belt Activity Tolerance: Patient tolerated treatment well Patient left: in chair;with call bell/phone within reach;Other (comment) (pt on bed alarm but  nsg states ok in chair witout alarm) Nurse Communication: Mobility status   Lennox Laity [phone] 11/20/2011, 3:29 PM
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
## 2011-11-20 NOTE — Plan of Care (Signed)
Problem: Phase I Progression Outcomes Goal: OOB as tolerated unless otherwise ordered Outcome: Progressing OOB up in chair today

## 2011-11-21 DIAGNOSIS — F1010 Alcohol abuse, uncomplicated: Secondary | ICD-10-CM

## 2011-11-21 DIAGNOSIS — R0602 Shortness of breath: Secondary | ICD-10-CM

## 2011-11-21 DIAGNOSIS — T7411XA Adult physical abuse, confirmed, initial encounter: Secondary | ICD-10-CM

## 2011-11-21 DIAGNOSIS — M7989 Other specified soft tissue disorders: Secondary | ICD-10-CM

## 2011-11-21 LAB — GLUCOSE, CAPILLARY
Glucose-Capillary: 108 mg/dL — ABNORMAL HIGH (ref 70–99)
Glucose-Capillary: 114 mg/dL — ABNORMAL HIGH (ref 70–99)
Glucose-Capillary: 164 mg/dL — ABNORMAL HIGH (ref 70–99)
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
## 2011-11-21 NOTE — Progress Notes (Signed)
Echocardiogram 2D Echocardiogram has been performed.  Glean Salen [facility] 11/21/2011, 9:53 AM
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
## 2011-11-21 NOTE — Plan of Care (Signed)
Problem: Phase I Progression Outcomes Goal: Voiding-avoid urinary catheter unless indicated Outcome: Completed/Met Date Met:  11/21/11 Foley out. Encouraging pt to use the urinal

## 2011-11-21 NOTE — Plan of Care (Signed)
Problem: Phase II Progression Outcomes Goal: Discharge plan established Outcome: Not Progressing Pt does not have a safe plan in [facility] currently
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
## 2011-11-22 DIAGNOSIS — R0602 Shortness of breath: Secondary | ICD-10-CM

## 2011-11-22 DIAGNOSIS — M7989 Other specified soft tissue disorders: Secondary | ICD-10-CM

## 2011-11-22 DIAGNOSIS — T7411XA Adult physical abuse, confirmed, initial encounter: Secondary | ICD-10-CM

## 2011-11-22 DIAGNOSIS — F1010 Alcohol abuse, uncomplicated: Secondary | ICD-10-CM

## 2011-11-22 LAB — GLUCOSE, CAPILLARY: Glucose-Capillary: 166 mg/dL — ABNORMAL HIGH (ref 70–99)

## 2011-11-22 MED ORDER — SALINE SPRAY 0.65 % NA SOLN
1.0000 | NASAL | Status: DC | PRN
  Filled 2011-11-22: qty 44

## 2011-11-22 MED ORDER — ASPIRIN 81 MG PO TBEC: 81.0000 mg | DELAYED_RELEASE_TABLET | Freq: Every day | ORAL | Status: AC

## 2011-11-22 MED ORDER — FUROSEMIDE 40 MG PO TABS: 40.0000 mg | ORAL_TABLET | Freq: Every day | ORAL | Status: DC

## 2011-11-22 MED ORDER — FOLIC ACID 1 MG PO TABS: 1.0000 mg | ORAL_TABLET | Freq: Every day | ORAL | Status: DC

## 2011-11-22 NOTE — Discharge Instructions (Signed)
It is very important that you follow up with your physician, as we discussed,  Your difficultly breathing is likely due to your smoking history and lung disease.

## 2011-11-22 NOTE — Progress Notes (Addendum)
Occupational Therapy Treatment Patient Details Name: Melvin Krueger MRN: [id number] DOB: 1951-02-16 Today's Date: 11/22/2011 Time: 4132-4401 OT Time Calculation (min): 38 min  OT Assessment / Plan / Recommendation Comments on Treatment Session Pt tolerated treatment well although at end of session reported 10/10 headache. Feel discharge plan is not confirmed for 24/7 care at home. May need SNF    Follow Up Recommendations  Home health OT; with 24/7 assist required. If this is not available will need Skilled nursing facility    Barriers to Discharge       Equipment Recommendations  3 in 1 bedside comode;None recommended by PT    Recommendations for Other Services    Frequency Min 2X/week   Plan Discharge plan needs to be updated    Precautions / Restrictions Precautions Precautions: Fall Precaution Comments: chronic weakness R side from previous stroke Restrictions Weight Bearing Restrictions: No        ADL  Lower Body Dressing: Performed;Set up;Other (comment) (to don tennis shoes at EOB) Where Assessed - Lower Body Dressing: Unsupported sitting Toilet Transfer: Performed;Minimal assistance;Other (comment) (with RW and tried walker splint on R with velcro strap) Toilet Transfer Method: Other (comment) (ambulating) Toilet Transfer Equipment: Raised toilet seat with arms (or 3-in-1 over toilet) Toileting - Clothing Manipulation and Hygiene: Simulated;Minimal assistance Where Assessed - Glass blower/designer Manipulation and Hygiene: Standing Equipment Used: Rolling walker;Other (comment) (RW hand grip) ADL Comments: Spoke to pt about his desire to go home. He doesnt confirm that 24/7 help is available. States a sister can help until another family member is available but doesnt state 24/7. Will need 24/7 to go home but would likely benefit from SNF rehab to increase independence. Pt did better with gripping RW using hand splint on R but still difficult to straighten fingers around  handle fully as hand will spontanously slip or fingers flex. Discussed 3in1 optino for safety if he does discharge home as he states he toilet is low  and states they are supposed to be getting him a taller toilet put it sometime. Pt reports foam grip did help using L hand to self feed since last visit. He reports decrease spillage. Addendum: Inquired about the charge for the hand splint for the RW and found out cost. Informed pt this would be charged to his hospital bill and pt states it is ok to charge item to him. He states hand splint did help with gripping RW during OT session and he has used one in the past and it helped but he no longer has it.     OT Diagnosis:    OT Problem List:   OT Treatment Interventions:     OT Goals ADL Goals ADL Goal: Toilet Transfer - Progress: Progressing toward goals Miscellaneous OT Goals OT Goal: Miscellaneous Goal #1 - Progress: Progressing toward goals OT Goal: Miscellaneous Goal #2 - Progress: Progressing toward goals  Visit Information  Last OT Received On: 11/22/11 Assistance Needed: +1    Subjective Data  Subjective: the grip helped some Patient Stated Goal: I want to get home and feed by dog   Prior Functioning       Cognition  Overall Cognitive Status: Appears within functional limits for tasks assessed/performed Arousal/Alertness: Awake/alert Orientation Level: Appears intact for tasks assessed Behavior During Session: [facility] for tasks performed    Mobility Bed Mobility Bed Mobility: Supine to Sit Supine to Sit: 4: Min assist;HOB elevated Sit to Supine: 4: Min assist Details for Bed Mobility Assistance: assist to pull  up to sitting and verbal cues for technique and hand placement.  Transfers Transfers: Sit to Stand;Stand to Sit Sit to Stand: 4: Min assist;With upper extremity assist;From bed;From chair/3-in-1 Stand to Sit: 4: Min assist;With upper extremity assist;To chair/3-in-1;To bed Details for Transfer Assistance: verbal cues for  hand placement with sitting and standing. Tends to pull up on RW. Did better with R hand grip placed on RW and pt able to manage strap and velcro with min assist to help position hand in grip.       Balance Balance Balance Assessed: Yes Dynamic Standing Balance Dynamic Standing - Level of Assistance: 4: Min assist;Other (comment) (to perform toilet hygiene.)  End of Session OT - End of Session Activity Tolerance: Patient tolerated treatment well Patient left: in bed;with call bell/phone within reach;with bed alarm set   Lennox Laity [phone] 11/22/2011, 12:01 PM
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
## 2011-11-22 NOTE — Progress Notes (Signed)
Physical Therapy Treatment Patient Details Name: Melvin Krueger MRN: [id number] DOB: 1951-04-29 Today's Date: 11/22/2011 Time: 9147-8295 PT Time Calculation (min): 26 min  PT Assessment / Plan / Recommendation Comments on Treatment Session  Pt doing better with R UE control on RW, with some difficutly intially, but better as ambulation continued.  Also with decreased control of RLE with cues provided for maintaining position inside RW.  Continues to state that he is anxious.  Unsure of how much assist is available for pt at home.  May need to consider short term SNF if 24/7 not available.      Follow Up Recommendations  Home health PT;Skilled nursing facility;Supervision/Assistance - 24 hour    Barriers to Discharge        Equipment Recommendations  3 in 1 bedside comode;None recommended by PT    Recommendations for Other Services    Frequency Min 3X/week   Plan Discharge plan needs to be updated    Precautions / Restrictions Precautions Precautions: Fall Precaution Comments: chronic weakness R side from previous stroke Restrictions Weight Bearing Restrictions: No   Pertinent Vitals/Pain No pain    Mobility  Bed Mobility Bed Mobility: Supine to Sit Supine to Sit: 4: Min assist;HOB elevated Details for Bed Mobility Assistance: Requires assist for RLE off of bed with cues for technique/UE placement.  Transfers Transfers: Sit to Stand;Stand to Sit Sit to Stand: 4: Min assist;From elevated surface;With upper extremity assist;From bed Stand to Sit: 4: Min guard;With upper extremity assist;With armrests;To chair/3-in-1 Details for Transfer Assistance: Cues for safety, hand placement when sitting/standing with some assist to steady when in standing.  Did somewhat better today with maintaining hand placement on RW with RUE.  Ambulation/Gait Ambulation/Gait Assistance: 4: Min assist Ambulation Distance (Feet): 90 Feet Assistive device: Rolling walker Ambulation/Gait Assistance  Details: Cues for safety and maintaining position inside of RW due to pt steering left and R LE getting outside of RW.  Pt did somewhat better with RUE placement on RW, however had some difficutly initially with steering.  Continues to have somewhat poor control of RLE, but better with continued amb.     Gait Pattern: Step-through pattern;Ataxic;Decreased stride length    Exercises General Exercises - Lower Extremity Ankle Circles/Pumps: AROM;Strengthening;Both;10 reps;Seated Long Arc Quad: Strengthening;Both;10 reps;Seated Hip ABduction/ADduction: Strengthening;Both;10 reps;Seated Hip Flexion/Marching: Strengthening;Both;10 reps;Seated   PT Diagnosis:    PT Problem List:   PT Treatment Interventions:     PT Goals Acute Rehab PT Goals PT Goal Formulation: With patient Time For Goal Achievement: 12/03/11 Potential to Achieve Goals: Good Pt will go Supine/Side to Sit: with supervision PT Goal: Supine/Side to Sit - Progress: Progressing toward goal Pt will go Sit to Stand: with supervision PT Goal: Sit to Stand - Progress: Progressing toward goal Pt will Transfer Bed to Chair/Chair to Bed: with supervision PT Transfer Goal: Bed to Chair/Chair to Bed - Progress: Progressing toward goal Pt will Ambulate: 51 - 150 feet;with min assist;with rolling walker PT Goal: Ambulate - Progress: Progressing toward goal  Visit Information  Last PT Received On: 11/22/11 Assistance Needed: +1    Subjective Data  Subjective: Why does everyone tell me to sit up? Patient Stated Goal: Home   Cognition  Overall Cognitive Status: Appears within functional limits for tasks assessed/performed Arousal/Alertness: Awake/alert Orientation Level: Appears intact for tasks assessed Behavior During Session: [facility] for tasks performed    Balance     End of Session PT - End of Session Activity Tolerance: Patient limited by  fatigue Patient left: in chair;with call bell/phone within reach;with chair alarm  set Nurse Communication: Mobility status    Page, Meribeth Mattes 11/22/2011, 11:13 AM

## 2011-11-22 NOTE — Evaluation (Signed)
Clinical/Bedside Swallow Evaluation Patient Details  Name: Melvin Krueger MRN: [id number] Date of Birth: 1950/11/04  Today's Date: 11/22/2011 Time: 4098-1191 SLP Time Calculation (min): 48 min  Past Medical History:  Past Medical History  Diagnosis Date  . Coronary artery disease     S/p CABG  . Hypertension   . Diabetes mellitus   . Stroke     CT head with old left occipital, old biparietal infacts. Has right weakness  . Anxiety   . Tobacco abuse   . Alcohol abuse   . PVD (peripheral vascular disease)     CABG and s/p left CEA    Past Surgical History:  Past Surgical History  Procedure Date  . Cardiac surgery     CABG  . Carotid endarterectomy     left side  . Heart stents    HPI:  61 yo male adm to [facility] with dyspnea, ETOH, xanax dependent.  Pt had h/o COPD, CVA x2 with right sided weakness- last one approx 2 1/2 years ago, CAD s/p CABG and left CEA.  Pt denies problems swallowing but does acknowledges occasionally getting choked when eating.    Assessment / Plan / Recommendation Clinical Impression  Pt appears with suspected primary esophageal/LPR issues without overt clinical s/s of aspiration.  Globus, hoarseness, cough with meal and frequent throat clearing appear to be dominant symptoms.    Pt observed consuming cracker, applesauce and water.  Wet vocal quality noted during intake- especially after water, but reflexive throat clearing cleared it.  Pt denies pulmonary infections/pnas but does acknowledge weight fluctuating although not due to problems swallowing.   Pt has been edentulous for years per his statement but appears to compensate well.  Pt verbalized that in the last 3 weeks he sensed food or pill lodging in throat, but clearing with liquid swallow.  Suspect vagal nerve may cause referrant sensation from esophagus to pharynx.    Productive cough of frothy clear secretions reported by pt approximately 30 minutes after eating - ? esophageal or reflux induced.  In  addition, pt reports consuming "lots of BC powders" , often on an empty stomach.  Pt reports he txs his occasional reflux with Tums.    Reflux Symptom Index (RSI) given with pt scoring 35 of 45 points, indicating high likelihood of laryngopharyngeal reflux per authors.  Educated pt to general aspiration precautions with his COPD and reflux precautions.   Provided written RSI scoring for pt to use to assess for improvement.    No further SLP indicated as all education completed.  Thanks for this consult.     Aspiration Risk  Mild    Diet Recommendation     Liquid Administration via: Straw;Cup Supervision: Patient able to self feed Compensations: Slow rate;Small sips/bites Postural Changes and/or Swallow Maneuvers: Seated upright 90 degrees;Upright 30-60 min after meal    Other  Recommendations Oral Care Recommendations: Oral care BID Other Recommendations: Other (Comment) (use thickener prn)   Follow Up Recommendations  None    Frequency and Duration   n/a     Pertinent Vitals/Pain Afebrile, rhonchi - CXR 6/13 NAD    SLP Swallow Goals   n/a  Swallow Study Prior Functional Status       General Date of Onset: 11/22/11 HPI: 61 yo male adm to [facility] with dyspnea, ETOH, xanax dependent.  Pt had h/o COPD, CVA x2 with right sided weakness- last one approx 2 1/2 years ago, CAD s/p CABG and left CEA.  Pt denies problems  swallowing but does acknowledges occasionally getting choked when eating.   Type of Study: Bedside swallow evaluation Diet Prior to this Study: Regular;Thin liquids Temperature Spikes Noted: No Respiratory Status: Room air Behavior/Cognition: Alert;Cooperative;Pleasant mood Oral Cavity - Dentition: Edentulous ("for a long time" per pt) Baseline Vocal Quality: Clear Volitional Cough: Strong Volitional Swallow: Able to elicit    Oral/Motor/Sensory Function Overall Oral Motor/Sensory Function: Other (comment) (app decr on left facial when speaking, but not with oral  motor exam)   Ice Chips Ice chips: Not tested   Thin Liquid Thin Liquid: Impaired Presentation: Self Fed;Straw Pharyngeal  Phase Impairments: Wet Vocal Quality Other Comments: cleared with reflexive throat clearing    Nectar Thick Nectar Thick Liquid: Not tested   Honey Thick Honey Thick Liquid: Not tested   Puree Puree: Within functional limits Presentation: Self Fed;Spoon Other Comments: applesauce x3 boluses   Solid Solid: Within functional limits Presentation: Self Fed Other Comments: cracker bolus x1    Donavan Burnet, MS [facility] SLP [phone]

## 2011-11-25 ENCOUNTER — Telehealth: Payer: Self-pay

## 2011-11-25 NOTE — Telephone Encounter (Signed)
Amber says the hospital mixed up the patients medications, before any more are prescribed she would like to have clarification on the meds patient is to take.  Please call [phone]

## 2011-11-26 NOTE — Telephone Encounter (Signed)
LMOM to call back

## 2011-11-27 NOTE — Telephone Encounter (Signed)
Called number in chart but not active number called number given for contact / Amber [phone] and left message for [facility].

## 2011-12-04 ENCOUNTER — Emergency Department (HOSPITAL_COMMUNITY)
Admission: EM | Admit: 2011-12-04 | Discharge: 2011-12-04 | Disposition: A | Discharge status: Home / Self Care | Payer: Medicare Other | Attending: Emergency Medicine | Admitting: Emergency Medicine

## 2011-12-04 ENCOUNTER — Encounter (HOSPITAL_COMMUNITY): Payer: Self-pay | Admitting: Emergency Medicine

## 2011-12-04 DIAGNOSIS — F22 Delusional disorders: Secondary | ICD-10-CM | POA: Insufficient documentation

## 2011-12-04 DIAGNOSIS — F419 Anxiety disorder, unspecified: Secondary | ICD-10-CM

## 2011-12-04 DIAGNOSIS — I2510 Atherosclerotic heart disease of native coronary artery without angina pectoris: Secondary | ICD-10-CM | POA: Insufficient documentation

## 2011-12-04 DIAGNOSIS — E119 Type 2 diabetes mellitus without complications: Secondary | ICD-10-CM | POA: Insufficient documentation

## 2011-12-04 DIAGNOSIS — Z760 Encounter for issue of repeat prescription: Secondary | ICD-10-CM | POA: Insufficient documentation

## 2011-12-04 DIAGNOSIS — F17200 Nicotine dependence, unspecified, uncomplicated: Secondary | ICD-10-CM | POA: Insufficient documentation

## 2011-12-04 DIAGNOSIS — R0602 Shortness of breath: Secondary | ICD-10-CM | POA: Insufficient documentation

## 2011-12-04 DIAGNOSIS — F411 Generalized anxiety disorder: Secondary | ICD-10-CM | POA: Insufficient documentation

## 2011-12-04 DIAGNOSIS — I10 Essential (primary) hypertension: Secondary | ICD-10-CM

## 2011-12-04 DIAGNOSIS — F1010 Alcohol abuse, uncomplicated: Secondary | ICD-10-CM | POA: Insufficient documentation

## 2011-12-04 DIAGNOSIS — Z951 Presence of aortocoronary bypass graft: Secondary | ICD-10-CM | POA: Insufficient documentation

## 2011-12-04 DIAGNOSIS — Z8673 Personal history of transient ischemic attack (TIA), and cerebral infarction without residual deficits: Secondary | ICD-10-CM | POA: Insufficient documentation

## 2011-12-04 LAB — RAPID URINE DRUG SCREEN, HOSP PERFORMED
Opiates: NOT DETECTED
Tetrahydrocannabinol: NOT DETECTED

## 2011-12-04 LAB — POCT I-STAT, CHEM 8
Chloride: 105 mEq/L (ref 96–112)
Creatinine, Ser: 0.7 mg/dL (ref 0.50–1.35)
Glucose, Bld: 132 mg/dL — ABNORMAL HIGH (ref 70–99)
HCT: 52 % (ref 39.0–52.0)
Potassium: 3.6 mEq/L (ref 3.5–5.1)
Sodium: 143 mEq/L (ref 135–145)

## 2011-12-04 LAB — CBC
MCHC: 35.6 g/dL (ref 30.0–36.0)
Platelets: 176 10*3/uL (ref 150–400)
RDW: 12.5 % (ref 11.5–15.5)
WBC: 6.5 10*3/uL (ref 4.0–10.5)

## 2011-12-04 LAB — URINALYSIS, ROUTINE W REFLEX MICROSCOPIC
Nitrite: NEGATIVE
Protein, ur: NEGATIVE mg/dL
Specific Gravity, Urine: 1.019 (ref 1.005–1.030)
Urobilinogen, UA: 0.2 mg/dL (ref 0.0–1.0)

## 2011-12-04 LAB — ETHANOL: Alcohol, Ethyl (B): 70 mg/dL — ABNORMAL HIGH (ref 0–11)

## 2011-12-04 MED ORDER — ALPRAZOLAM 0.5 MG PO TABS
1.0000 mg | ORAL_TABLET | Freq: Once | ORAL | Status: AC
  Administered 2011-12-04: 1 mg via ORAL
  Filled 2011-12-04: qty 2

## 2011-12-04 MED ORDER — ASPIRIN EC 81 MG PO TBEC
81.0000 mg | DELAYED_RELEASE_TABLET | Freq: Every day | ORAL | Status: DC
  Administered 2011-12-04: 81 mg via ORAL
  Filled 2011-12-04: qty 1

## 2011-12-04 MED ORDER — METOPROLOL SUCCINATE ER 50 MG PO TB24: 50.0000 mg | ORAL_TABLET | Freq: Every day | ORAL | Status: DC

## 2011-12-04 MED ORDER — FOLIC ACID 1 MG PO TABS
1.0000 mg | ORAL_TABLET | Freq: Every day | ORAL | Status: DC
  Administered 2011-12-04: 1 mg via ORAL
  Filled 2011-12-04: qty 1

## 2011-12-04 MED ORDER — PAROXETINE HCL 20 MG PO TABS
40.0000 mg | ORAL_TABLET | Freq: Every day | ORAL | Status: DC
  Administered 2011-12-04: 40 mg via ORAL
  Filled 2011-12-04: qty 2

## 2011-12-04 MED ORDER — ALPRAZOLAM 0.5 MG PO TABS
1.0000 mg | ORAL_TABLET | Freq: Four times a day (QID) | ORAL | Status: DC | PRN
  Administered 2011-12-04: 1 mg via ORAL
  Filled 2011-12-04: qty 2

## 2011-12-04 MED ORDER — METOPROLOL SUCCINATE ER 50 MG PO TB24
50.0000 mg | ORAL_TABLET | Freq: Every day | ORAL | Status: DC
  Administered 2011-12-04: 50 mg via ORAL
  Filled 2011-12-04: qty 1

## 2011-12-04 MED ORDER — ACETAMINOPHEN 500 MG PO TABS: 500.0000 mg | ORAL_TABLET | Freq: Four times a day (QID) | ORAL | Status: DC | PRN

## 2011-12-04 MED ORDER — HALOPERIDOL 2 MG PO TABS
2.0000 mg | ORAL_TABLET | Freq: Two times a day (BID) | ORAL | Status: DC
  Administered 2011-12-04: 2 mg via ORAL
  Filled 2011-12-04: qty 1

## 2011-12-04 MED ORDER — FUROSEMIDE 40 MG PO TABS
40.0000 mg | ORAL_TABLET | Freq: Every day | ORAL | Status: DC
  Administered 2011-12-04: 40 mg via ORAL
  Filled 2011-12-04 (×2): qty 1

## 2011-12-04 MED ORDER — ADULT MULTIVITAMIN W/MINERALS CH
1.0000 | ORAL_TABLET | Freq: Every day | ORAL | Status: DC
  Administered 2011-12-04: 1 via ORAL
  Filled 2011-12-04: qty 1

## 2011-12-04 MED ORDER — LAMOTRIGINE 200 MG PO TABS
200.0000 mg | ORAL_TABLET | Freq: Two times a day (BID) | ORAL | Status: DC
  Administered 2011-12-04: 200 mg via ORAL
  Filled 2011-12-04 (×2): qty 1
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
## 2011-12-04 NOTE — ED Notes (Signed)
Meal tray ordered 

## 2011-12-04 NOTE — ED Notes (Signed)
Discharge Instructions explained and given to patient; pt verbalized understanding instructions.
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
## 2011-12-04 NOTE — ED Notes (Signed)
Bed:WA30<BR> Expected date:<BR> Expected time:<BR> Means of arrival:<BR> Comments:<BR> closed

## 2011-12-04 NOTE — ED Notes (Signed)
Patient reports that he takes 1 mg of xanax at bedtime. Dr. Dierdre Highman notified and new orders received.

## 2011-12-04 NOTE — Discharge Instructions (Signed)
Anxiety and Panic Attacks Your caregiver has informed you that you are having an anxiety or panic attack. There may be many forms of this. Most of the time these attacks come suddenly and without warning. They come at any time of day, including periods of sleep, and at any time of life. They may be strong and unexplained. Although panic attacks are very scary, they are physically harmless. Sometimes the cause of your anxiety is not known. Anxiety is a protective mechanism of the body in its fight or flight mechanism. Most of these perceived danger situations are actually nonphysical situations (such as anxiety over losing a job). CAUSES  The causes of an anxiety or panic attack are many. Panic attacks may occur in otherwise healthy people given a certain set of circumstances. There may be a genetic cause for panic attacks. Some medications may also have anxiety as a side effect. SYMPTOMS  Some of the most common feelings are:  Intense terror.   Dizziness, feeling faint.   Hot and cold flashes.   Fear of going crazy.   Feelings that nothing is real.   Sweating.   Shaking.   Chest pain or a fast heartbeat (palpitations).   Smothering, choking sensations.   Feelings of impending doom and that death is near.   Tingling of extremities, this may be from over-breathing.   Altered reality (derealization).   Being detached from yourself (depersonalization).  Several symptoms can be present to make up anxiety or panic attacks. DIAGNOSIS  The evaluation by your caregiver will depend on the type of symptoms you are experiencing. The diagnosis of anxiety or panic attack is made when no physical illness can be determined to be a cause of the symptoms. TREATMENT  Treatment to prevent anxiety and panic attacks may include:  Avoidance of circumstances that cause anxiety.   Reassurance and relaxation.   Regular exercise.   Relaxation therapies, such as yoga.   Psychotherapy with a  psychiatrist or therapist.   Avoidance of caffeine, alcohol and illegal drugs.   Prescribed medication.  SEEK IMMEDIATE MEDICAL CARE IF:   You experience panic attack symptoms that are different than your usual symptoms.   You have any worsening or concerning symptoms.  Document Released: 05/24/2005 Document Revised: 05/13/2011 Document Reviewed: 09/25/2009 [facility] Patient Information 2012 [place], [place].Arterial Hypertension Arterial hypertension (high blood pressure) is a condition of elevated pressure in your blood vessels. Hypertension over a long period of time is a risk factor for strokes, heart attacks, and heart failure. It is also the leading cause of kidney (renal) failure.  CAUSES   In Adults -- Over 90% of all hypertension has no known cause. This is called essential or primary hypertension. In the other 10% of people with hypertension, the increase in blood pressure is caused by another disorder. This is called secondary hypertension. Important causes of secondary hypertension are:   Heavy alcohol use.   Obstructive sleep apnea.   Hyperaldosterosim (Conn's syndrome).   Steroid use.   Chronic kidney failure.   Hyperparathyroidism.   Medications.   Renal artery stenosis.   Pheochromocytoma.   Cushing's disease.   Coarctation of the aorta.   Scleroderma renal crisis.   Licorice (in excessive amounts).   Drugs (cocaine, methamphetamine).  Your caregiver can explain any items above that apply to you.  In Children -- Secondary hypertension is more common and should always be considered.   Pregnancy -- Few women of childbearing age have high blood pressure. However, up to 10%  of them develop hypertension of pregnancy. Generally, this will not harm the woman. It may be a sign of 3 complications of pregnancy: preeclampsia, HELLP syndrome, and eclampsia. Follow up and control with medication is necessary.  SYMPTOMS   This condition normally does not produce any  noticeable symptoms. It is usually found during a routine exam.   Malignant hypertension is a late problem of high blood pressure. It may have the following symptoms:   Headaches.   Blurred vision.   End-organ damage (this means your kidneys, heart, lungs, and other organs are being damaged).   Stressful situations can increase the blood pressure. If a person with normal blood pressure has their blood pressure go up while being seen by their caregiver, this is often termed "white coat hypertension." Its importance is not known. It may be related with eventually developing hypertension or complications of hypertension.   Hypertension is often confused with mental tension, stress, and anxiety.  DIAGNOSIS  The diagnosis is made by 3 separate blood pressure measurements. They are taken at least 1 week apart from each other. If there is organ damage from hypertension, the diagnosis may be made without repeat measurements. Hypertension is usually identified by having blood pressure readings:  Above 140/90 mmHg measured in both arms, at 3 separate times, over a couple weeks.   Over 130/80 mmHg should be considered a risk factor and may require treatment in patients with diabetes.  Blood pressure readings over 120/80 mmHg are called "pre-hypertension" even in non-diabetic patients. To get a true blood pressure measurement, use the following guidelines. Be aware of the factors that can alter blood pressure readings.  Take measurements at least 1 hour after caffeine.   Take measurements 30 minutes after smoking and without any stress. This is another reason to quit smoking - it raises your blood pressure.   Use a proper cuff size. Ask your caregiver if you are not sure about your cuff size.   Most home blood pressure cuffs are automatic. They will measure systolic and diastolic pressures. The systolic pressure is the pressure reading at the start of sounds. Diastolic pressure is the pressure at  which the sounds disappear. If you are elderly, measure pressures in multiple postures. Try sitting, lying or standing.   Sit at rest for a minimum of 5 minutes before taking measurements.   You should not be on any medications like decongestants. These are found in many cold medications.   Record your blood pressure readings and review them with your caregiver.  If you have hypertension:  Your caregiver may do tests to be sure you do not have secondary hypertension (see "causes" above).   Your caregiver may also look for signs of metabolic syndrome. This is also called Syndrome X or Insulin Resistance Syndrome. You may have this syndrome if you have type 2 diabetes, abdominal obesity, and abnormal blood lipids in addition to hypertension.   Your caregiver will take your medical and family history and perform a physical exam.   Diagnostic tests may include blood tests (for glucose, cholesterol, potassium, and kidney function), a urinalysis, or an EKG. Other tests may also be necessary depending on your condition.  PREVENTION  There are important lifestyle issues that you can adopt to reduce your chance of developing hypertension:  Maintain a normal weight.   Limit the amount of salt (sodium) in your diet.   Exercise often.   Limit alcohol intake.   Get enough potassium in your diet. Discuss specific advice  with your caregiver.   Follow a DASH diet (dietary approaches to stop hypertension). This diet is rich in fruits, vegetables, and low-fat dairy products, and avoids certain fats.  PROGNOSIS  Essential hypertension cannot be cured. Lifestyle changes and medical treatment can lower blood pressure and reduce complications. The prognosis of secondary hypertension depends on the underlying cause. Many people whose hypertension is controlled with medicine or lifestyle changes can live a normal, healthy life.  RISKS AND COMPLICATIONS  While high blood pressure alone is not an illness, it  often requires treatment due to its short- and long-term effects on many organs. Hypertension increases your risk for:  CVAs or strokes (cerebrovascular accident).   Heart failure due to chronically high blood pressure (hypertensive cardiomyopathy).   Heart attack (myocardial infarction).   Damage to the retina (hypertensive retinopathy).   Kidney failure (hypertensive nephropathy).  Your caregiver can explain list items above that apply to you. Treatment of hypertension can significantly reduce the risk of complications. TREATMENT   For overweight patients, weight loss and regular exercise are recommended. Physical fitness lowers blood pressure.   Mild hypertension is usually treated with diet and exercise. A diet rich in fruits and vegetables, fat-free dairy products, and foods low in fat and salt (sodium) can help lower blood pressure. Decreasing salt intake decreases blood pressure in a 1/3 of people.   Stop smoking if you are a smoker.  The steps above are highly effective in reducing blood pressure. While these actions are easy to suggest, they are difficult to achieve. Most patients with moderate or severe hypertension end up requiring medications to bring their blood pressure down to a normal level. There are several classes of medications for treatment. Blood pressure pills (antihypertensives) will lower blood pressure by their different actions. Lowering the blood pressure by 10 mmHg may decrease the risk of complications by as much as 25%. The goal of treatment is effective blood pressure control. This will reduce your risk for complications. Your caregiver will help you determine the best treatment for you according to your lifestyle. What is excellent treatment for one person, may not be for you. HOME CARE INSTRUCTIONS   Do not smoke.   Follow the lifestyle changes outlined in the "Prevention" section.   If you are on medications, follow the directions carefully. Blood  pressure medications must be taken as prescribed. Skipping doses reduces their benefit. It also puts you at risk for problems.   Follow up with your caregiver, as directed.   If you are asked to monitor your blood pressure at home, follow the guidelines in the "Diagnosis" section above.  SEEK MEDICAL CARE IF:   You think you are having medication side effects.   You have recurrent headaches or lightheadedness.   You have swelling in your ankles.   You have trouble with your vision.  SEEK IMMEDIATE MEDICAL CARE IF:   You have sudden onset of chest pain or pressure, difficulty breathing, or other symptoms of a heart attack.   You have a severe headache.   You have symptoms of a stroke (such as sudden weakness, difficulty speaking, difficulty walking).  MAKE SURE YOU:   Understand these instructions.   Will watch your condition.   Will get help right away if you are not doing well or get worse.  Document Released: 05/24/2005 Document Revised: 05/13/2011 Document Reviewed: 12/22/2006 [facility] Patient Information 2012 [place], [place].

## 2011-12-04 NOTE — ED Notes (Signed)
MD at bedside. 
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
## 2011-12-06 ENCOUNTER — Encounter (HOSPITAL_COMMUNITY): Payer: Self-pay | Admitting: *Deleted

## 2011-12-06 ENCOUNTER — Emergency Department (HOSPITAL_COMMUNITY)
Admission: EM | Admit: 2011-12-06 | Discharge: 2011-12-06 | Disposition: A | Discharge status: Home / Self Care | Payer: Medicare Other | Attending: Emergency Medicine | Admitting: Emergency Medicine

## 2011-12-06 ENCOUNTER — Emergency Department (HOSPITAL_COMMUNITY): Payer: Medicare Other

## 2011-12-06 DIAGNOSIS — Z8673 Personal history of transient ischemic attack (TIA), and cerebral infarction without residual deficits: Secondary | ICD-10-CM | POA: Insufficient documentation

## 2011-12-06 DIAGNOSIS — I10 Essential (primary) hypertension: Secondary | ICD-10-CM | POA: Insufficient documentation

## 2011-12-06 DIAGNOSIS — Z760 Encounter for issue of repeat prescription: Secondary | ICD-10-CM | POA: Insufficient documentation

## 2011-12-06 DIAGNOSIS — F411 Generalized anxiety disorder: Secondary | ICD-10-CM | POA: Insufficient documentation

## 2011-12-06 DIAGNOSIS — F419 Anxiety disorder, unspecified: Secondary | ICD-10-CM

## 2011-12-06 DIAGNOSIS — Z79899 Other long term (current) drug therapy: Secondary | ICD-10-CM | POA: Insufficient documentation

## 2011-12-06 DIAGNOSIS — F17200 Nicotine dependence, unspecified, uncomplicated: Secondary | ICD-10-CM | POA: Insufficient documentation

## 2011-12-06 DIAGNOSIS — F3289 Other specified depressive episodes: Secondary | ICD-10-CM | POA: Insufficient documentation

## 2011-12-06 DIAGNOSIS — E119 Type 2 diabetes mellitus without complications: Secondary | ICD-10-CM | POA: Insufficient documentation

## 2011-12-06 DIAGNOSIS — F329 Major depressive disorder, single episode, unspecified: Secondary | ICD-10-CM | POA: Insufficient documentation

## 2011-12-06 DIAGNOSIS — I2510 Atherosclerotic heart disease of native coronary artery without angina pectoris: Secondary | ICD-10-CM | POA: Insufficient documentation

## 2011-12-06 LAB — BASIC METABOLIC PANEL
BUN: 9 mg/dL (ref 6–23)
CO2: 25 mEq/L (ref 19–32)
Chloride: 99 mEq/L (ref 96–112)
GFR calc non Af Amer: >90 mL/min (ref >90)
Glucose, Bld: 126 mg/dL — ABNORMAL HIGH (ref 70–99)
Potassium: 3.7 mEq/L (ref 3.5–5.1)
Sodium: 137 mEq/L (ref 135–145)

## 2011-12-06 LAB — URINALYSIS, ROUTINE W REFLEX MICROSCOPIC
Nitrite: NEGATIVE
Specific Gravity, Urine: 1.02 (ref 1.005–1.030)
Urobilinogen, UA: 4 mg/dL — ABNORMAL HIGH (ref 0.0–1.0)
pH: 7 (ref 5.0–8.0)

## 2011-12-06 LAB — CBC WITH DIFFERENTIAL/PLATELET
Eosinophils Absolute: 0 10*3/uL (ref 0.0–0.7)
Hemoglobin: 17.8 g/dL — ABNORMAL HIGH (ref 13.0–17.0)
Lymphocytes Relative: 13 % (ref 12–46)
Lymphs Abs: 1.1 10*3/uL (ref 0.7–4.0)
MCH: 33.3 pg (ref 26.0–34.0)
Monocytes Relative: 7 % (ref 3–12)
Neutro Abs: 6.9 10*3/uL (ref 1.7–7.7)
Neutrophils Relative %: 80 % — ABNORMAL HIGH (ref 43–77)
Platelets: 188 10*3/uL (ref 150–400)
RBC: 5.34 MIL/uL (ref 4.22–5.81)
WBC: 8.6 10*3/uL (ref 4.0–10.5)

## 2011-12-06 LAB — RAPID URINE DRUG SCREEN, HOSP PERFORMED
Benzodiazepines: POSITIVE — AB
Cocaine: NOT DETECTED
Opiates: NOT DETECTED
Tetrahydrocannabinol: NOT DETECTED

## 2011-12-06 LAB — URINE MICROSCOPIC-ADD ON

## 2011-12-06 LAB — GLUCOSE, CAPILLARY

## 2011-12-06 MED ORDER — LAMOTRIGINE 200 MG PO TABS
200.0000 mg | ORAL_TABLET | Freq: Once | ORAL | Status: AC
  Administered 2011-12-06: 200 mg via ORAL
  Filled 2011-12-06: qty 1

## 2011-12-06 MED ORDER — ALPRAZOLAM 0.5 MG PO TABS
1.0000 mg | ORAL_TABLET | Freq: Once | ORAL | Status: AC
  Administered 2011-12-06: 1 mg via ORAL
  Filled 2011-12-06: qty 1

## 2011-12-06 MED ORDER — HALOPERIDOL 1 MG PO TABS
1.0000 mg | ORAL_TABLET | Freq: Once | ORAL | Status: AC
  Administered 2011-12-06: 1 mg via ORAL
  Filled 2011-12-06: qty 1

## 2011-12-06 MED ORDER — ALPRAZOLAM 0.5 MG PO TABS
1.0000 mg | ORAL_TABLET | Freq: Once | ORAL | Status: AC
Start: 2011-12-06 — End: 2011-12-06
  Filled 2011-12-06: qty 1

## 2011-12-06 MED ORDER — RAMIPRIL 5 MG PO CAPS
5.0000 mg | ORAL_CAPSULE | Freq: Every day | ORAL | Status: DC
  Administered 2011-12-06: 5 mg via ORAL
  Filled 2011-12-06: qty 1

## 2011-12-06 MED ORDER — METOPROLOL TARTRATE 25 MG PO TABS
50.0000 mg | ORAL_TABLET | Freq: Once | ORAL | Status: AC
  Administered 2011-12-06: 50 mg via ORAL
  Filled 2011-12-06: qty 1

## 2011-12-06 NOTE — ED Notes (Signed)
[facility]  Mare Loan [phone] phoned and message left by this RN to please call the ED to discuss her father's placement needs. Will wait to hear back from her.

## 2011-12-06 NOTE — Progress Notes (Signed)
CSW spoke with patient about ALF options. Discussed with patient that Home Health can and will assist him with nursing home placement, he simply needs to let them know.  DSS workers came by to visit with patient to assess situation and are aware of his current location/complaints and will continue to follow up.  Patient has been provided with ALF information and encouraged to discuss placement options with home health social worker.    Melvin Krueger ANN S , MSW, LCSWA 12/06/2011  4:24 PM [phone]

## 2011-12-06 NOTE — ED Notes (Signed)
MD at bedside. 

## 2011-12-06 NOTE — ED Notes (Signed)
Previous rn states pt is waiting on ptar

## 2011-12-06 NOTE — ED Notes (Signed)
[facility] CALLED to transport pt home.

## 2011-12-06 NOTE — ED Notes (Signed)
Patient unable to urinate at this time. Patient has condom cath on. Patient encouraged to call for assistance.

## 2011-12-06 NOTE — ED Notes (Signed)
Critical glucose=723 from triage blood draw at 12:45

## 2011-12-06 NOTE — ED Notes (Signed)
Pt's sister called by this RN to discuss his need for assistance in procuring placement in a care home. Sister Malachi Bonds gave me his daughter's phone number. She works for a [employer] office, and is able to assist him with placement from home.

## 2011-12-06 NOTE — ED Notes (Signed)
Pt in from home by ems. C/o generalized weakness x1 week. Also reports being out of meds for a few days. Tried to get meds refilled last week, and Pharmacy said it was too early. Pt has Hx stroke with right sided deficits. No new deficits noted.

## 2011-12-06 NOTE — ED Notes (Signed)
Pt eating with good appetite

## 2011-12-06 NOTE — Discharge Instructions (Signed)
Medication Refill, Emergency Department We have refilled your medication today as a courtesy to you. It is best for your medical care, however, to take care of getting refills done through your primary caregiver's office. They have your records and can do a better job of follow-up than we can in the emergency department. On maintenance medications, we often only prescribe enough medications to get you by until you are able to see your regular caregiver. This is a more expensive way to refill medications. In the future, please plan for refills so that you will not have to use the emergency department for this. Thank you for your help. Your help allows [place] to better take care of the daily emergencies that enter our department. Document Released: 09/10/2003 Document Revised: 05/13/2011 Document Reviewed: 05/24/2005 [facility] Patient Information 2012 [place], [place].Anxiety and Panic Attacks Anxiety is your body's way of reacting to real danger or something you think is a danger. It may be fear or worry over a situation like losing your job. Sometimes the cause is not known. A panic attack is made up of physical signs like sweating, shaking, or chest pain. Anxiety and panic attacks may start suddenly. They may be strong. They may come at any time of day, even while sleeping. They may come at any time of life. Panic attacks are scary, but they do not harm you physically.  HOME CARE  Avoid any known causes of your anxiety.   Try to relax. Yoga may help. Tell yourself everything will be okay.   Exercise often.   Get expert advice and help (therapy) to stop anxiety or attacks from happening.   Avoid caffeine, alcohol, and drugs.   Only take medicine as told by your doctor.  GET HELP RIGHT AWAY IF:  Your attacks seem different than normal attacks.   Your problems are getting worse or concern you.  MAKE SURE YOU:  Understand these instructions.   Will watch your condition.   Will get help right  away if you are not doing well or get worse.  Document Released: 06/26/2010 Document Revised: 05/13/2011 Document Reviewed: 06/26/2010 [facility] Patient Information 2012 [place], [place].
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
## 2011-12-08 NOTE — Telephone Encounter (Signed)
[facility] is trying to place pt and she is needing an SLT on pt he was also seen this am 12-18-11, pt is out of all his medications please contact [place] @ [phone]

## 2011-12-15 NOTE — Telephone Encounter (Signed)
Melvin Krueger from [facility] she is needing a verbal auth to continue pt occupational and physical therapy at home. Please contact [place] @ [phone]

## 2011-12-15 NOTE — Telephone Encounter (Signed)
Called Shaunda back and gave her VO per Dr L for [facility] to continue OT and PT for pt. They will cont and will fax a written form over for signature. Asked Shaunda if [facility] still needed another form filled out for pt and was advised that they desperately need an FL-2 form filled out for pt. He is currently living in a hotel and they need to get him into assisted living. Dr L, do you fill out these forms for pts? I wanted to check w/you bf calling Cindy back.

## 2011-12-16 ENCOUNTER — Telehealth: Payer: Self-pay
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
## 2011-12-16 NOTE — Telephone Encounter (Signed)
Myrene Buddy with [facility] would like for nurse or Dr to contact her concerning pt please call [phone] ask for [facility]

## 2011-12-29 ENCOUNTER — Telehealth: Payer: Self-pay

## 2011-12-29 NOTE — Telephone Encounter (Signed)
PT'S SISTER STATES THAT [place] [facility] WOULD LIKE FOR [place] TO FAX OVER PT'S SL2 FORM. FAX: [phone] LOVF#[phone] (PTS SISTER)

## 2011-12-30 ENCOUNTER — Telehealth: Payer: Self-pay

## 2011-12-30 NOTE — Telephone Encounter (Signed)
Lupita Leash is saying to take a message re her dad and needing another fl2 form filled out that says nursing facility.if any questions call donna at her desk.

## 2011-12-31 NOTE — Telephone Encounter (Signed)
PLEASE FIND CHART AND SEND SL2 FORMS THAT ARE IN CHART

## 2011-12-31 NOTE — Telephone Encounter (Signed)
Can we see if this form has been faxed and then get it to Dr. Ellis Parents box please.

## 2011-12-31 NOTE — Telephone Encounter (Signed)
Form is in the front of paper chart. Chart put in Dr Milus Glazier box per Lupita Leash request since that is patient PCP. Lupita Leash also requests diabetic shoes for patient.

## 2012-01-01 NOTE — Telephone Encounter (Signed)
CHART IS IN YOUR BOX IN DR LOUNGE TO FILL OUT FORM.  ALSO PT NEEDS DIABETIC SHOES-RX

## 2012-01-03 ENCOUNTER — Telehealth: Payer: Self-pay

## 2012-01-03 NOTE — Telephone Encounter (Signed)
Last communication stated pt needs nursing home placement and FL 2 forms being sent over, but Melvin Krueger is wanting to extend O T for him please advise what plan is, I will call Harriett Sine at home [phone]

## 2012-01-03 NOTE — Telephone Encounter (Signed)
[facility]ue[facility] from [facility] calling to get authorization from dr l to get an extention patients occupational physical therapy please call nancy at [phone]

## 2012-01-04 ENCOUNTER — Telehealth: Payer: Self-pay

## 2012-01-04 NOTE — Telephone Encounter (Signed)
pts nursing home called [facility], stated that patient needs refill on all medications and would need to be done pretty quickly if possible the number of the pharmacy is [phone] that is the pharmacy that the nursing home deals with.

## 2012-01-05 NOTE — Telephone Encounter (Signed)
Spoke w/Donna Azucena Cecil, pt's daughter who stated that the [facility] form and DM shoes have been taken care of by Dr Elbert Ewings

## 2012-01-05 NOTE — Telephone Encounter (Signed)
Yes, all the meds listed EXCEPT meloxicam.  I do not see that on her list.

## 2012-01-05 NOTE — Telephone Encounter (Signed)
Spoke w/pt's daughter, Mare Loan, who stated that the form and OT orders have been done. No further action needed for this.

## 2012-01-05 NOTE — Telephone Encounter (Signed)
This has been done - see notes on 12/29/11 phone message.

## 2012-01-05 NOTE — Telephone Encounter (Signed)
Called in all Rxs below (except for Meloxicam) to pharmacy for 30 - day supplies w/8 RFs (to equal what was left on Rxs Dr L wrote at May OV) but w/ only 2 RFs each on Xanax, Lasix and Haldol. Dr Milus Glazier, do you want to Rx the pt's Meloxicam 7.5 BID for him? Please review and I will call it in if you agree.

## 2012-01-05 NOTE — Telephone Encounter (Signed)
DR.LAUENSTEIN,  NANCY FROM [facility] IS CALLING TO SEE IF THE PT COULD HAVE AN EXTENSION FOR  HIS OT FOR 2 TIMES A DAY FOR 2 WEEKS. PT WOULD ALSO WOULD LIKE TO INFORM YOU THAT PT FELL OFF OF HIS BED, PT IS JUST SORE NO KNOWN INJURIES OCCURRED DUE TO THE FALL.  BEST# [phone]

## 2012-01-05 NOTE — Telephone Encounter (Signed)
Verified w/[facility] at [facility] that pt is currently taking the following which all need to be called into pharmacy at # in phone message bc they package them in bubble packs (even OTC need to be called in). Can we Rx these to new pharmacy or wait for Dr L tomorrow. Pt will run out of his xanax today. Metoprolol succ XL 50 mg QD Altace 5 mg QD Lamictal 200 BID Xanax 1 mg QID Haldol 1 mg Qhs  Paxil 40 mg Qhs Meloxicam 7.5 BID Lasix 40 mg QD Folic Acid 0.4 mg QD ASA 81 mg QD Multivit QD

## 2012-01-07 ENCOUNTER — Telehealth: Payer: Self-pay

## 2012-01-07 NOTE — Telephone Encounter (Signed)
Home Health Aide is needing a verbal to continue home health.  The order she had has ran out and is needing someone to call her with a verbal to continue home health.   Best#: [id number]

## 2012-01-09 NOTE — Telephone Encounter (Signed)
Okay to give verbal for another 30 days

## 2012-01-09 NOTE — Telephone Encounter (Signed)
LMOM with verbal order.

## 2012-02-21 ENCOUNTER — Telehealth: Payer: Self-pay | Admitting: Radiology

## 2012-02-21 NOTE — Telephone Encounter (Signed)
Need to find out where patient can get wrist brace and call Angela back # [phone].

## 2012-02-21 NOTE — Telephone Encounter (Signed)
Patient has wrist brace on order, but Marylene Land wants to know where patient can get the brace, she has tried several places. I need to find out and I will call back to advise. Angela # [phone]

## 2012-02-22 NOTE — Telephone Encounter (Signed)
She is advised of this, and will get this for him

## 2012-02-22 NOTE — Telephone Encounter (Signed)
Can usually get this done with hand PT or OT I have tried to call Marylene Land, unable to get through, tried x2 will try again later.

## 2012-02-22 NOTE — Telephone Encounter (Signed)
NURSING HOME CALLED AGAIN FOR REFERRAL TO PLACE A HAND SPLINT. Virgina Evener ASAP AT [phone]

## 2012-03-28 ENCOUNTER — Ambulatory Visit: Payer: Self-pay | Admitting: Cardiovascular Disease

## 2012-04-05 ENCOUNTER — Ambulatory Visit: Payer: Self-pay | Admitting: Vascular Surgery

## 2012-04-05 LAB — CREATININE, SERUM
Creatinine: 0.44 mg/dL — ABNORMAL LOW (ref 0.60–1.30)
EGFR (African American): >60
EGFR (Non-African Amer.): >60

## 2012-04-06 ENCOUNTER — Encounter: Payer: Self-pay | Admitting: Family Medicine

## 2012-04-16 ENCOUNTER — Telehealth: Payer: Self-pay | Admitting: Physician Assistant

## 2012-04-16 MED ORDER — RAMIPRIL 5 MG PO CAPS: 5.0000 mg | ORAL_CAPSULE | Freq: Every day | ORAL | Status: DC

## 2012-04-16 NOTE — Telephone Encounter (Signed)
Pharmacy requests 90 day supply of ramipril, order changed

## 2012-05-25 ENCOUNTER — Observation Stay: Payer: Self-pay | Admitting: Internal Medicine

## 2012-05-25 LAB — URINALYSIS, COMPLETE
Bacteria: NONE SEEN
Bilirubin,UR: NEGATIVE
Glucose,UR: NEGATIVE mg/dL (ref 0–75)
Ketone: NEGATIVE
Leukocyte Esterase: NEGATIVE
RBC,UR: 1 /HPF (ref 0–5)
Squamous Epithelial: <1

## 2012-05-25 LAB — COMPREHENSIVE METABOLIC PANEL
Alkaline Phosphatase: 115 U/L (ref 50–136)
Anion Gap: 3 — ABNORMAL LOW (ref 7–16)
Calcium, Total: 8.9 mg/dL (ref 8.5–10.1)
Co2: 31 mmol/L (ref 21–32)
EGFR (African American): >60
EGFR (Non-African Amer.): >60
Glucose: 97 mg/dL (ref 65–99)
SGOT(AST): 32 U/L (ref 15–37)
Sodium: 134 mmol/L — ABNORMAL LOW (ref 136–145)

## 2012-05-25 LAB — CBC
MCHC: 34.8 g/dL (ref 32.0–36.0)
MCV: 93 fL (ref 80–100)
RBC: 4.44 10*6/uL (ref 4.40–5.90)
RDW: 13.2 % (ref 11.5–14.5)

## 2012-05-25 LAB — PROTIME-INR: INR: 0.9

## 2012-05-25 LAB — TROPONIN I: Troponin-I: <0.02 ng/mL

## 2012-05-26 DIAGNOSIS — I517 Cardiomegaly: Secondary | ICD-10-CM

## 2012-05-26 LAB — CBC WITH DIFFERENTIAL/PLATELET
Basophil %: 0.6 %
Eosinophil %: 2.6 %
HCT: 40.3 % (ref 40.0–52.0)
HGB: 13.7 g/dL (ref 13.0–18.0)
Lymphocyte #: 1.5 10*3/uL (ref 1.0–3.6)
MCH: 31.3 pg (ref 26.0–34.0)
MCV: 92 fL (ref 80–100)
Monocyte #: 0.6 x10 3/mm (ref 0.2–1.0)
Neutrophil #: 4.6 10*3/uL (ref 1.4–6.5)
Platelet: 193 10*3/uL (ref 150–440)
RBC: 4.37 10*6/uL — ABNORMAL LOW (ref 4.40–5.90)
WBC: 6.9 10*3/uL (ref 3.8–10.6)

## 2012-05-26 LAB — LIPID PANEL
Cholesterol: 88 mg/dL (ref 0–200)
HDL Cholesterol: 23 mg/dL — ABNORMAL LOW (ref 40–60)
Ldl Cholesterol, Calc: 23 mg/dL (ref 0–100)
VLDL Cholesterol, Calc: 42 mg/dL — ABNORMAL HIGH (ref 5–40)

## 2012-05-26 LAB — BASIC METABOLIC PANEL
Anion Gap: 4 — ABNORMAL LOW (ref 7–16)
Calcium, Total: 9.1 mg/dL (ref 8.5–10.1)
Chloride: 104 mmol/L (ref 98–107)
Co2: 31 mmol/L (ref 21–32)
EGFR (African American): >60
Osmolality: 277 (ref 275–301)

## 2012-06-12 ENCOUNTER — Emergency Department: Payer: Self-pay | Admitting: Internal Medicine

## 2012-06-13 ENCOUNTER — Telehealth: Payer: Self-pay | Admitting: *Deleted

## 2012-06-13 NOTE — Telephone Encounter (Signed)
Please call patient and tell him that Dr. Milus Glazier will be back 1/10 and he will make the decision about the refill at that time.  Then please route to Dr. Jeanice Lim.

## 2012-06-13 NOTE — Telephone Encounter (Signed)
Pharmacy requesting refill on haloperidol 1mg .  Last refill 05/17/12

## 2012-06-14 ENCOUNTER — Other Ambulatory Visit: Payer: Self-pay | Admitting: Family Medicine

## 2012-06-14 DIAGNOSIS — I10 Essential (primary) hypertension: Secondary | ICD-10-CM

## 2012-06-14 DIAGNOSIS — F419 Anxiety disorder, unspecified: Secondary | ICD-10-CM

## 2012-06-14 MED ORDER — METOPROLOL SUCCINATE ER 50 MG PO TB24: 50.0000 mg | ORAL_TABLET | Freq: Every day | ORAL | Status: DC

## 2012-06-14 MED ORDER — PAROXETINE HCL 40 MG PO TABS: 40.0000 mg | ORAL_TABLET | ORAL | Status: AC

## 2012-06-14 MED ORDER — LAMOTRIGINE 200 MG PO TABS: 200.0000 mg | ORAL_TABLET | Freq: Two times a day (BID) | ORAL | Status: AC

## 2012-06-14 MED ORDER — ALPRAZOLAM 1 MG PO TABS: 1.0000 mg | ORAL_TABLET | Freq: Four times a day (QID) | ORAL | Status: DC | PRN

## 2012-06-14 MED ORDER — HALOPERIDOL 1 MG PO TABS: 1.0000 mg | ORAL_TABLET | Freq: Every day | ORAL | Status: DC

## 2012-06-14 MED ORDER — FUROSEMIDE 40 MG PO TABS: 40.0000 mg | ORAL_TABLET | Freq: Every day | ORAL | Status: DC

## 2012-06-14 NOTE — Telephone Encounter (Signed)
His meds have to be ordered then dispensed to him in assisted living, he will run out if we do not get him a supply, can we send in a few for him until Dr Milus Glazier comes back to the office, I spoke to his Daughter, Mare Loan. Please advise Corinthia Helmers

## 2012-06-14 NOTE — Telephone Encounter (Signed)
Dr Milus Glazier has come in, message forwarded to him.

## 2012-06-14 NOTE — Telephone Encounter (Signed)
Tried to call pt and got message that "customer is not taking calls right now, please try again later". Could not LM. I am forwarding this to Dr Loma Boston box also for review of RF request.

## 2012-07-18 ENCOUNTER — Inpatient Hospital Stay: Payer: Self-pay | Admitting: Internal Medicine

## 2012-07-18 LAB — DRUG SCREEN, URINE
Barbiturates, Ur Screen: NEGATIVE (ref ?–200)
Benzodiazepine, Ur Scrn: POSITIVE (ref ?–200)
Cannabinoid 50 Ng, Ur ~~LOC~~: NEGATIVE (ref ?–50)
Cocaine Metabolite,Ur ~~LOC~~: NEGATIVE (ref ?–300)
MDMA (Ecstasy)Ur Screen: NEGATIVE (ref ?–500)
Opiate, Ur Screen: NEGATIVE (ref ?–300)
Phencyclidine (PCP) Ur S: NEGATIVE (ref ?–25)

## 2012-07-18 LAB — COMPREHENSIVE METABOLIC PANEL
Alkaline Phosphatase: 137 U/L — ABNORMAL HIGH (ref 50–136)
Bilirubin,Total: 0.3 mg/dL (ref 0.2–1.0)
Chloride: 103 mmol/L (ref 98–107)
Co2: 28 mmol/L (ref 21–32)
EGFR (African American): >60
EGFR (Non-African Amer.): >60
Osmolality: 273 (ref 275–301)

## 2012-07-18 LAB — CBC
HCT: 38.6 % — ABNORMAL LOW (ref 40.0–52.0)
MCH: 32.5 pg (ref 26.0–34.0)
Platelet: 158 10*3/uL (ref 150–440)
RBC: 4.09 10*6/uL — ABNORMAL LOW (ref 4.40–5.90)
RDW: 14 % (ref 11.5–14.5)
WBC: 7 10*3/uL (ref 3.8–10.6)

## 2012-07-18 LAB — PROTIME-INR: INR: 1

## 2012-07-18 LAB — URINALYSIS, COMPLETE
Bacteria: NONE SEEN
Bilirubin,UR: NEGATIVE
Leukocyte Esterase: NEGATIVE
Nitrite: NEGATIVE
RBC,UR: <1 /HPF (ref 0–5)
Specific Gravity: 1.005 (ref 1.003–1.030)
WBC UR: NONE SEEN /HPF (ref 0–5)

## 2012-07-18 LAB — ETHANOL
Ethanol %: <0.003 % (ref 0.000–0.080)
Ethanol: <3 mg/dL

## 2012-07-19 LAB — BASIC METABOLIC PANEL
BUN: 9 mg/dL (ref 7–18)
Calcium, Total: 9.1 mg/dL (ref 8.5–10.1)
Chloride: 108 mmol/L — ABNORMAL HIGH (ref 98–107)
EGFR (African American): >60
Sodium: 143 mmol/L (ref 136–145)

## 2012-07-19 LAB — CBC WITH DIFFERENTIAL/PLATELET
Basophil #: 0.1 10*3/uL (ref 0.0–0.1)
Eosinophil #: 0.3 10*3/uL (ref 0.0–0.7)
Eosinophil %: 3 %
MCH: 32.6 pg (ref 26.0–34.0)
MCHC: 33.7 g/dL (ref 32.0–36.0)
RDW: 14.4 % (ref 11.5–14.5)

## 2012-07-19 LAB — TROPONIN I
Troponin-I: <0.02 ng/mL
Troponin-I: <0.02 ng/mL

## 2012-07-19 LAB — MAGNESIUM: Magnesium: 2 mg/dL

## 2012-07-20 LAB — URINE CULTURE

## 2012-07-21 LAB — POTASSIUM: Potassium: 3.3 mmol/L — ABNORMAL LOW (ref 3.5–5.1)

## 2012-07-23 LAB — BASIC METABOLIC PANEL
Anion Gap: 5 — ABNORMAL LOW (ref 7–16)
BUN: 16 mg/dL (ref 7–18)
Chloride: 103 mmol/L (ref 98–107)
Co2: 29 mmol/L (ref 21–32)
EGFR (African American): >60
Potassium: 3.3 mmol/L — ABNORMAL LOW (ref 3.5–5.1)

## 2012-07-23 LAB — CBC WITH DIFFERENTIAL/PLATELET
Basophil %: 0.5 %
Eosinophil #: 0.1 10*3/uL (ref 0.0–0.7)
Eosinophil %: 1.3 %
Lymphocyte #: 1.2 10*3/uL (ref 1.0–3.6)
MCV: 95 fL (ref 80–100)
Monocyte %: 6.5 %
Neutrophil #: 6.2 10*3/uL (ref 1.4–6.5)
Neutrophil %: 77 %
Platelet: 138 10*3/uL — ABNORMAL LOW (ref 150–440)
RBC: 3.84 10*6/uL — ABNORMAL LOW (ref 4.40–5.90)
RDW: 13.7 % (ref 11.5–14.5)

## 2012-10-24 ENCOUNTER — Ambulatory Visit: Payer: Self-pay | Admitting: Vascular Surgery

## 2012-10-24 LAB — BASIC METABOLIC PANEL
Anion Gap: 5 — ABNORMAL LOW (ref 7–16)
BUN: 23 mg/dL — ABNORMAL HIGH (ref 7–18)
Calcium, Total: 8.7 mg/dL (ref 8.5–10.1)
Chloride: 102 mmol/L (ref 98–107)
EGFR (African American): >60
EGFR (Non-African Amer.): >60
Glucose: 103 mg/dL — ABNORMAL HIGH (ref 65–99)
Osmolality: 276 (ref 275–301)
Potassium: 4.2 mmol/L (ref 3.5–5.1)
Sodium: 136 mmol/L (ref 136–145)

## 2013-09-25 ENCOUNTER — Ambulatory Visit: Payer: Self-pay | Admitting: Family Medicine

## 2014-03-06 ENCOUNTER — Telehealth: Payer: Self-pay

## 2014-03-06 NOTE — Telephone Encounter (Signed)
Rockney Ghee from [employer] is calling to ask for [place] to send OV notes that are specifically relating to patient's diabetic shoes. Howard's phone number is [phone], fax # [phone]

## 2014-03-07 NOTE — Telephone Encounter (Signed)
Spoke to [place]. Pt has not been in since 2013 and needs an OV to provide office notes regarding his shoes

## 2014-06-02 ENCOUNTER — Emergency Department: Payer: Self-pay | Admitting: Emergency Medicine

## 2014-06-02 LAB — COMPREHENSIVE METABOLIC PANEL
ALBUMIN: 3.8 g/dL (ref 3.4–5.0)
ALK PHOS: 129 U/L — AB
AST: 26 U/L (ref 15–37)
Anion Gap: 5 — ABNORMAL LOW (ref 7–16)
BUN: 15 mg/dL (ref 7–18)
Bilirubin,Total: 0.5 mg/dL (ref 0.2–1.0)
CALCIUM: 8.3 mg/dL — AB (ref 8.5–10.1)
CO2: 30 mmol/L (ref 21–32)
Chloride: 97 mmol/L — ABNORMAL LOW (ref 98–107)
Creatinine: 0.89 mg/dL (ref 0.60–1.30)
EGFR (African American): >60
EGFR (Non-African Amer.): >60
Glucose: 177 mg/dL — ABNORMAL HIGH (ref 65–99)
Osmolality: 270 (ref 275–301)
Potassium: 4.4 mmol/L (ref 3.5–5.1)
SGPT (ALT): 20 U/L
Sodium: 132 mmol/L — ABNORMAL LOW (ref 136–145)
TOTAL PROTEIN: 7.2 g/dL (ref 6.4–8.2)

## 2014-06-02 LAB — URINALYSIS, COMPLETE
BACTERIA: NONE SEEN
Bilirubin,UR: NEGATIVE
Blood: NEGATIVE
KETONE: NEGATIVE
Leukocyte Esterase: NEGATIVE
NITRITE: NEGATIVE
PH: 7 (ref 4.5–8.0)
PROTEIN: NEGATIVE
RBC,UR: 3 /HPF (ref 0–5)
SPECIFIC GRAVITY: 1.012 (ref 1.003–1.030)
Squamous Epithelial: <1

## 2014-06-02 LAB — CBC
HCT: 42.8 % (ref 40.0–52.0)
HGB: 14 g/dL (ref 13.0–18.0)
MCH: 30.3 pg (ref 26.0–34.0)
MCHC: 32.8 g/dL (ref 32.0–36.0)
MCV: 93 fL (ref 80–100)
PLATELETS: 163 10*3/uL (ref 150–440)
RBC: 4.63 10*6/uL (ref 4.40–5.90)
RDW: 12.9 % (ref 11.5–14.5)
WBC: 9.1 10*3/uL (ref 3.8–10.6)

## 2014-06-02 LAB — LIPASE, BLOOD: Lipase: 71 U/L — ABNORMAL LOW (ref 73–393)

## 2014-07-18 ENCOUNTER — Ambulatory Visit: Payer: Self-pay | Admitting: Gastroenterology

## 2014-09-24 ENCOUNTER — Ambulatory Visit
Admit: 2014-09-24 | Disposition: A | Discharge status: Home / Self Care | Payer: Self-pay | Attending: Gastroenterology | Admitting: Gastroenterology

## 2014-09-24 NOTE — Consult Note (Signed)
Referring Physician:  Alba Destine :   Primary Care Physician:  Alba Destine : [facility], [address], [place], [place] [address], [place]  Brunetta Genera, [place] : [facility], [place] [address], [place], [place], [place] [address], [place] [phone]  Reason for Consult:  Admit Date: 25-May-2012   Chief Complaint: CVA   Reason for Consult: CVA   History of Present Illness:  History of Present Illness:   HPI: man with a history of CVA with residual R sided weakness presents to [facility] with three days of slurred speech.  He does not ambulate due to prior stroke with R sided weakness.  The patient was seen in October 2013 to have 70% carotid artery stenosis bilaterally.  He has been followed for this issue with Dr. Delana Meyer vascular surgery.  He continues to smoke.  No other clear new neurologic deficits. [place] he has been seen to have bradycardia into the 40s with SBPs in the 100-120s. 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 Bilateral carotid artery stenosis. Bilateral cataracts.   No known neurosurgeries. HISTORY: The patient smokes a pack a day. He does not drink alcohol. No illicit drugs. He moves around in a motorized wheelchair.  Resident of an assisted living facility. His sister is his health care power of attorney.   FAMILY HISTORY: Reviewed and patient does not know.  HOME MEDICATIONS:  1. Alprazolam 1 mg 4 times a day.  Aspirin 81 mg oral once a day.  Atorvastatin 80 mg oral once a day.  Finasteride 5 mg oral once a day.  Folic acid 0.4 mg oral once a day.  Lasix 40 mg oral once a day.  Allopurinol 1 mg oral once a day.  Lamotrigine  200 mg oral 2 times a day.  Metoprolol succinate 50 mg oral once a day.  Pravastatin 40 mg oral once a day.  Plavix 75 mg oral once a day.  Ramipril 5 mg  oral once a day.  Tamsulosin 0.4 mg oral once a day.  Tramadol 50 mg oral 3 times a day.   ALLERGIES: PENICILLIN, WHICH CAUSES RASH.  GENERAL: NAD.  Normocephalic and atraumatic. exam shows normal disc size, appearance and C/D ratio without clear evidence of papilledema. and S2 sounds are within normal limits, without murmurs, gallops, or rubs. - Normal- NormalDrift - Absent bilaterally.- Gait and station are within normal limits.- Absent    Shoulder abduction (deltoid/supraspinatus, axillary/suprascapular n, C5)   Elbow flexion (biceps brachii, musculoskeletal n, C5-6)   Elbow extension (triceps, radial n, C7)   Finger adduction (interossei, ulnar n, T1)    Hip flexion (iliopsoas, L1/L2)   Knee flexion (hamstrings, sciatic n, L5/S1)    Knee extension (quadriceps, femoral n, L3/4)   Ankle dorsiflexion (tibialis anterior, deep fibular n, L4/5)   Ankle plantarflexion (gastroc, tibial n, S1)  STATUS:is oriented to person, place and time.  Recent and remote memory are intact.  Attention span and concentration are intact.  Naming, repetition, comprehension and expressive speech are within normal limits.  Patient's fund of knowledge is within normal limits for educational level. NERVES:   CN II (normal visual acuity and visual fields)   CN III, IV, VI (extraocular muscles are intact)   CN V (facial sensation is intact bilaterally)   CN VII (facial strength is intact bilaterally)   CN VIII (hearing is intact bilaterally)   CN  IX/X (palate elevates midline, normal phonation)   CN XI (shoulder shrug strength is normal and symmetric)   CN XII (tongue protrudes midline) to pain and temp bilaterally (spinothalamic tracts)to position and vibration bilaterally (dorsal columns)    Biceps   Brachioradialis    Patellar   Achilles to nose testing is within normal limits.      ROS:   General denies complaints    HEENT no complaints    Lungs no complaints    Cardiac no complaints    GI no complaints     GU no complaints    Musculoskeletal no complaints    Extremities no complaints    Skin no complaints    Endocrine no complaints   Past Medical/Surgical Hx:  carotid stenosisw:   CAD:   htn:   CVA:   CABG:   Home Medications: Medication Instructions Last Modified Date/Time  azithromycin 250 mg oral tablet 1 tab(s) orally once a day 20-Dec-13 14:52  furosemide 20 mg oral tablet 1 tab(s) orally once a day 20-Dec-13 14:52  aspirin 81 mg oral tablet 1 tab(s) orally once a day [id number] 92:33  folic acid 0.4 mg oral tablet 1 tab(s) orally once a day 19-Dec-13 10:48  lamotrigine 200 mg oral tablet 1 tab(s) orally 2 times a day 19-Dec-13 10:48  Plavix 75 mg oral tablet 1 tab(s) orally once a day 19-Dec-13 10:48  alprazolam 1 mg oral tablet 1 tab(s) orally 4 times a day 19-Dec-13 10:48  tramadol 50 mg oral tablet 1 tab(s) orally 3 times a day 19-Dec-13 10:48  atorvastatin 80 mg oral tablet 1 tab(s) orally once a day (at bedtime) 19-Dec-13 10:48  haloperidol 1 mg oral tablet 1 tab(s) orally once a day 19-Dec-13 10:48  paroxetine 40 mg oral tablet 1 tab(s) orally once a day 19-Dec-13 10:48  tamsulosin 0.4 mg oral capsule 1 cap(s) orally once a day 19-Dec-13 10:48  finasteride 5 mg oral tablet 1 tab(s) orally once a day 19-Dec-13 10:48   [facility] Current Meds:  Sodium Chloride 0.9% injection, 2 ml, IV push, q6h  Acetaminophen * tablet, ( Tylenol (325 mg) tablet)  650 mg Oral q4h PRN for pain or temp. greater than 100.4  - Indication: Pain/Fever  Enoxaparin injection, ( Lovenox injection )  40 mg, Subcutaneous, daily  Indication: Prophylaxis or treatment of thromboembolic disorders, Monitor Anticoags per hospital protocol  Insulin SS -Novolog injection, Subcutaneous, FSBS before meals and at bedtime  0 units if FSBS 0-150     2 unit(s) if FSBS 151 - 200     4 unit(s) if FSBS 201 - 250     6 unit(s) if FSBS 251 - 300     8 unit(s) if FSBS 301 - 350     10 unit(s) if FSBS 351 -  400  Call MD if FSBS is greater than 400, [Waste Code: Black]  Ondansetron injection, ( Zofran injection )  4 mg, IV push, q4h PRN for Nausea/Vomiting  Indication: Nausea/ Vomiting  Pantoprazole tablet, 40 mg Oral q6am  - Indication: Erosive Esophagitis/ GERD  Instructions:  DO NOT CRUSH  Aspirin Chewable, 81 mg Oral daily  - Indication: Pain/Fever/Thromboembolic Disorders/Post MI/Prophylaxis MI  Finasteride tablet, ( Proscar)  5 mg Oral daily  - Indication: Benign Prostatic Hyperplasia  Tamsulosin capsule, ( Flomax)  0.4 mg Oral at bedtime  - Indication: Symptoms of Benign Prostatic Hyperplasma  lamoTRIgine tablet, ( LaMICtal)  200 mg Oral bid  - Indication: Seizures/Mood Stabilizer  PARoxetine tablet, (  Paxil)  40 mg Oral daily  - Indication: Depression/ Panic/ OCD  traMADol tablet, ( Ultram)  50 mg Oral q6h PRN for pain  - Indication: Pain  atorvaSTATin tablet, 80 mg Oral at bedtime  - Indication: Hypercholesterolemia  ALPRAZolam tablet, 0.5 mg Oral q8h PRN for anxiety  - Indication: Anxiety/ Depression/ Panic  Clopidogrel tablet, 75 mg Oral daily  Instructions:  Initiate Bleeding Precautions Protocol  Haloperidol tablet, ( Haldol)  1 mg Oral daily PRN for psychosis  - Indication: Psychosis/ Delirium  Folic Acid tablet, ( Folate)  400 mcg Oral daily  - Indication: Folic Acid Deficiency  Furosemide tablet, ( Lasix)  20 mg Oral daily  - Indication: Diuresis  Azithromycin tablet,  ( Zithromax)  500 mg Oral STAT  - Indication: Infection  Azithromycin tablet,  ( Zithromax)  250 mg Oral daily  - Indication: Infection  Allergies:  Penicillin: Unknown  Vital Signs: **Vital Signs.:   20-Dec-13 04:25   Vital Signs Type Routine   Temperature Temperature (F) 98.2   Celsius 36.7   Temperature Source Oral   Pulse Pulse 61   Respirations Respirations 18   Systolic BP Systolic BP 496   Diastolic BP (mmHg) Diastolic BP (mmHg) 75   Mean BP 98   Pulse Ox % Pulse Ox % 96    Pulse Ox Activity Level  At rest   Oxygen Delivery Room Air/ 21 %    08:11   Vital Signs Type Routine; off floor   Oxygen Delivery off floor    12:15   Vital Signs Type Routine   Temperature Temperature (F) 97.9   Celsius 36.6   Temperature Source Oral   Pulse Pulse 59   Respirations Respirations 18   Systolic BP Systolic BP 759   Diastolic BP (mmHg) Diastolic BP (mmHg) 70   Mean BP 86   Pulse Ox % Pulse Ox % 97   Pulse Ox Activity Level  At rest   Oxygen Delivery Room Air/ 21 %   Lab Results: Hepatic:  19-Dec-13 11:29    Bilirubin, Total 0.3   Alkaline Phosphatase 115   SGPT (ALT) 26   SGOT (AST) 32   Total Protein, Serum 7.1   Albumin, Serum 3.7  Cardiology:  20-Dec-13 07:31    Echo Doppler  Interpretation Summary   No cardiac source of emboli noted.   Left ventricular systolic function is normal. Ejection Fraction =  >55%. The transmitral spectral Doppler flow pattern is suggestive of  impaired LV relaxation. There is mild concentric left ventricular  hypertrophy. The left atrium is mildly dilated. Mild atherosclerotic  plaque(s) in the ascending aorta.  Procedure:   A two-dimensional transthoracic echocardiogram with color flow and  Doppler was performed.  Left Ventricle   The left ventricular wall motion is normal.   Ejection Fraction = >55%.   The transmitral spectral Doppler flow pattern is abnormal for age.   The transmitral spectral Doppler flow pattern is suggestive of  impaired LV relaxation.   The left ventricle is normal in size.   There is mild concentric left ventricular hypertrophy.   Left ventricular systolic function is normal.  Right Ventricle   The right ventricle is normal size.   There is normal right ventricular wall thickness.  The right ventricular systolic function is normal.  Atria   The left atrium is mildly dilated.   Right atrial size is normal.  Mitral Valve   The mitral valve is grossly normal.   There is mild  mitral  annular calcification.   No significantmitral valve stenosis.   There is trace mitral regurgitation.  Tricuspid Valve   The tricuspid valve is not well visualized, but is grossly normal.   No significant tricuspid stenosis.   No tricuspid regurgitation.  Aortic Valve   The aorticvalve opens well.   No aortic regurgitation is present.   No hemodynamically significant valvular aortic stenosis.  Pulmonic Valve   There is no pulmonic valvular regurgitation.   The pulmonic valve is not well visualized.  Great Vessels   The aortic root is normal size.   Mild atherosclerotic plaque(s) in the ascending aorta.  Pericardium/Pleural   No pericardial effusion.  MMode 2D Measurements and Calculations   RVDd: 3.5 cm   IVSd: 1.2 cm   LVIDd: 4.5 cm   LVIDs: 2.7 cm   LVPWd: 1.4 cm   FS: 40 %   EF(Teich): 70 %   Ao root diam: 2.8 cm   LA dimension: 3.4 cm   LVOT diam: 2.2 cm  Doppler Measurements and Calculations   MV E point: 71 cm/sec   MV A point: 95 cm/sec   MV E/A: 0.75    MV dec time: 0.24 sec   Ao V2 max: 98 cm/sec   Ao max PG: 4.0 mmHg   AVA(V,D): 2.5 cm2   LV max PG: 2.0 mmHg   LV V1 max: 63 cm/sec   PA V2 max: 81 cm/sec   PA max PG: 3.0 mmHg   TR Max vel: 186 cm/sec   TR Max PG: 14 mmHg   RVSP: 19 mmHg   RAP systole: 5.0 mmHg  Reading Physician: Kathlyn Sacramento  Sonographer: Sherrie Sport Interpreting Physician:  Kathlyn Sacramento,  electronically signed on  05-26-2012 09:03:47 Requesting Physician: Kathlyn Sacramento  Routine Chem:  19-Dec-13 11:29    Glucose, Serum 97   BUN 14   Creatinine (comp) 0.89   Sodium, Serum  134   Potassium, Serum  3.3   Chloride, Serum 100   CO2, Serum 31   Calcium (Total), Serum 8.9   Anion Gap  3   Osmolality (calc) 269   eGFR (African American) >60   eGFR (Non-African American) >60 (eGFR values <39m/min/1.73 m2 may be an indication of chronic kidney disease (CKD). Calculated eGFR is useful in patients with stable renal  function. The eGFR calculation will not be reliable in acutely ill patients when serum creatinine is changing rapidly. It is not useful in  patients on dialysis. The eGFR calculation may not be applicable to patients at the low and high extremes of body sizes, pregnant women, and vegetarians.)  20-Dec-13 04:47    Glucose, Serum 89   BUN 12   Creatinine (comp) 0.86   Sodium, Serum 139   Potassium, Serum 4.2   Chloride, Serum 104   CO2, Serum 31   Calcium (Total), Serum 9.1   Anion Gap  4   Osmolality (calc) 277   eGFR (African American) >60   eGFR (Non-African American) >60 (eGFR values <672mmin/1.73 m2 may be an indication of chronic kidney disease (CKD). Calculated eGFR is useful in patients with stable renal function. The eGFR calculation will not be reliable in acutely ill patients when serum creatinine is changing rapidly. It is not useful in  patients on dialysis. The eGFR calculation may not be applicable to patients at the low and high extremes of body sizes, pregnant women, and vegetarians.)   Cholesterol, Serum 88   Triglycerides, Serum  212   HDL (  INHOUSE)  23   VLDL Cholesterol Calculated  42   LDL Cholesterol Calculated 23 (Result(s) reported on 26 May 2012 at 05:27AM.)  Cardiac:  19-Dec-13 11:29    Troponin I < 0.02 (0.00-0.05 0.05 ng/mL or less: NEGATIVE  Repeat testing in 3-6 hrs  if clinically indicated. >0.05 ng/mL: POTENTIAL  MYOCARDIAL INJURY. Repeat  testing in 3-6 hrs if  clinically indicated. NOTE: An increase or decrease  of 30% or more on serial  testing suggests a  clinically important change)  Routine UA:  19-Dec-13 11:27    Color (UA) Yellow   Clarity (UA) Clear   Glucose (UA) Negative   Bilirubin (UA) Negative   Ketones (UA) Negative   Specific Gravity (UA) 1.009   Blood (UA) Negative   pH (UA) 6.0   Protein (UA) Negative   Nitrite (UA) Negative   Leukocyte Esterase (UA) Negative (Result(s) reported on 25 May 2012 at 11:55AM.)    RBC (UA) 1 /HPF   WBC (UA) <1 /HPF   Bacteria (UA) NONE SEEN   Epithelial Cells (UA) <1 /HPF   Mucous (UA) PRESENT   Hyaline Cast (UA) 5 /LPF (Result(s) reported on 25 May 2012 at 11:55AM.)  Routine Coag:  19-Dec-13 11:29    Prothrombin 12.0   INR 0.9 (INR reference interval applies to patients on anticoagulant therapy. A single INR therapeutic range for coumarins is not optimal for all indications; however, the suggested range for most indications is 2.0 - 3.0. Exceptions to the INR Reference Range may include: Prosthetic heart valves, acute myocardial infarction, prevention of myocardial infarction, and combinations of aspirin and anticoagulant. The need for a higher or lower target INR must be assessed individually. Reference: The Pharmacology and Management of the Vitamin K  antagonists: the seventh ACCP Conference on Antithrombotic and Thrombolytic Therapy. ZOXWR.6045 Sept:126 (3suppl): [id number]. A HCT value >55% may artifactually increase the PT.  In one study,  the increase was an average of 25%. Reference:  "Effect on Routine and Special Coagulation Testing Values of Citrate Anticoagulant Adjustment in Patients with High HCT Values." American Journal of Clinical Pathology 2006;126:400-405.)  Routine Hem:  19-Dec-13 11:29    WBC (CBC) 7.5   RBC (CBC) 4.44   Hemoglobin (CBC) 14.4   Hematocrit (CBC) 41.3   Platelet Count (CBC) 205 (Result(s) reported on 25 May 2012 at 12:23PM.)   MCV 93   MCH 32.3   MCHC 34.8   RDW 13.2  20-Dec-13 04:47    WBC (CBC) 6.9   RBC (CBC)  4.37   Hemoglobin (CBC) 13.7   Hematocrit (CBC) 40.3   Platelet Count (CBC) 193   MCV 92   MCH 31.3   MCHC 34.0   RDW 13.1   Neutrophil % 66.1   Lymphocyte % 22.0   Monocyte % 8.7   Eosinophil % 2.6   Basophil % 0.6   Neutrophil # 4.6   Lymphocyte # 1.5   Monocyte # 0.6   Eosinophil # 0.2   Basophil # 0.0 (Result(s) reported on 26 May 2012 at [facility].)   Radiology Results: MRI:     20-Dec-13 09:12, MRI Brain Without Contrast   MRI Brain Without Contrast    PRELIMINARY REPORT    The following is a PRELIMINARY Radiology report.  A final report will follow pending radiologist verification.      REASON FOR EXAM:    slurred speech  COMMENTS:       PROCEDURE: MR  - MR BRAIN WO CONTRAST  -  May 26 2012  9:12AM     RESULT:     Technique: Multiplanar and multisequence imaging of the brain was   obtained without the administration of gadolinium.    Findings: Diffusion weighted imaging demonstrates no abnormal foci of   restricted diffusion. An area of focal low-attenuation projects within   the posteroparietal temporal region on the left demonstrating increased   signal on T2 and ADC mapping consistent with an area of encephalomalacic   change. No further intra-axial nor extra-axial fluid collections are     identified. There is diffuse cortical atrophy as well as diffuse areas of   increased T2 signal within the subcortical, deep, and periventricular   white matter regions. There is no evidence of mass effect. The sella and   parasellar regions and structures as well as the cerebellopontine angle   regions and visualized portions of the 7th and 8th cranial nerves   demonstrate no signal abnormalities. There is diffuse cortical atrophy as   well as diffuse areas ofmoderate to severe increased T2 signal within   the subcortical, deep, and periventricular white matter regions.    IMPRESSION:      1. Severe involutional changes.   2. Area likely representing chronic infarction with encephalomalacic   change in the left temporoparietal region. No evidence of acute   abnormalities.  Thank you for the opportunity to contribute to the care of your patient.         Dictated By: Mikki Santee, M.D., MD  CT:    19-Dec-13 11:07, CT Head Without Contrast   CT Head Without Contrast    REASON FOR EXAM:    slurred speech  COMMENTS:       PROCEDURE: CT  - CT HEAD  WITHOUT CONTRAST  - May 25 2012 11:07AM     RESULT: Head CT dated 05/25/2012.    Technique: Helical noncontrasted 3 mm sections were obtained from the   skull base to the vertex.    Findings: There is diffuse cortical atrophy as well as diffuse areas   low-attenuation within the subcortical, deep, and periventricular white   matter regions. An area of encephalomalacic sick change projects in the   posterior temporoparietal region on the left. There is no evidence of   mass effect. Is no evidence of acute hemorrhage. Is no evidence of     depressed skull fracture. The visualized paranasal sinuses and mastoid   air cells are patent.    IMPRESSION:  Chronic and involutional changes as described above. No   evidence of acute abnormalities.        Verified By: Mikki Santee, M.D., MD   Electronic Signatures: Anabel Bene (MD)  (Signed 20-Dec-13 15:31)  Authored: REFERRING PHYSICIAN, Primary Care Physician, Consult, History of Present Illness, Review of Systems, PAST MEDICAL/SURGICAL HISTORY, HOME MEDICATIONS, Current Medications, ALLERGIES, NURSING VITAL SIGNS, LAB RESULTS, RADIOLOGY RESULTS   Last Updated: 20-Dec-13 15:31 by Anabel Bene (MD)

## 2014-09-24 NOTE — H&P (Signed)
PATIENT NAME:  Melvin Krueger, Melvin Krueger MR#:  [id number] DATE OF BIRTH:  17-Jul-1950  DATE OF ADMISSION:  05/25/2012  PRIMARY CARE PHYSICIAN: Dr. Brunetta Genera   NOTE: History was obtained from the patient and his sister at bedside. The case was discussed with Dr. Benjaman Lobe of the ER. Imaging studies and old records were reviewed.   CHIEF COMPLAINT: Slurred speech and generalized weakness.   HISTORY OF PRESENTING ILLNESS: The patient is a 64 year old Caucasian male patient with history of prior CVA with right-sided residual weakness, hypertension, CABG and critical carotid stenosis bilaterally, presents to the Emergency Room complaining of three days of slurred speech. The patient was also told that he was talking funny, as per his co-resident at an assisted living facility. Sister at bedside mentions that the patient's speech has been normal all along but presently has slurred speech. He  normally is in a wheelchair, is unable to ambulate for over two years now. This has not changed, but the patient complains of generalized weakness. The patient did see Dr. Delana Meyer of vascular surgery in October 2013, had diagnostic carotid angiography. The patient was found to have extensive disease bilaterally with greater than 70% stenosis on both sides. Although a stent was not thought to be possible, the patient was referred to [place], the patient was seen and they were awaiting discussing his case with Dr. Delana Meyer again about possible surgery, as per his sister at bedside.   He has not had any dysphagia, change in vision, change in hearing. He continues to smoke. He is a resident of an assisted living facility. He  uses a wheelchair.   Here in the Emergency Room, the patient has had episodes of bradycardia as low as 44; presently, he is between 50 to 55. He is on metoprolol at home. His blood pressure has been running between 353 to 614 systolic.   PAST MEDICAL HISTORY: Coronary artery disease, status post  coronary artery bypass graft, hypertension, cerebrovascular accident eight years prior with residual right-sided weakness. Bilateral carotid artery stenosis. Bilateral cataracts.   SOCIAL HISTORY: The patient smokes a pack a day. He does not drink alcohol. No illicit drugs. He moves around in a motorized wheelchair.  Resident of an assisted living facility. His sister is his health care power of attorney.   CODE STATUS:  FULL CODE.     ALLERGIES: PENICILLIN, WHICH CAUSES RASH.  REVIEW OF SYSTEMS:  CONSTITUTIONAL: Complains of fatigue, weakness. No weight loss or weight gain.  EYES: Has some blurred vision secondary to cataracts. No pain or redness.  ENT: No tinnitus, hearing loss or ear pain.  RESPIRATORY: Has some on and off dry cough. No wheeze, hemoptysis, or dyspnea.  CARDIOVASCULAR: No chest pain, orthopnea, edema.  GASTROINTESTINAL: No nausea, vomiting, diarrhea. Has constipation.  GENITOURINARY: No dysuria, hematuria, frequency. Has kidney stones.  ENDOCRINE: No polyuria, nocturia, thyroid problems.  HEMATOLOGIC/LYMPHATIC: No anemia, easy bruising, or bleeding.  INTEGUMENTARY: No rash, lesions.  MUSCULOSKELETAL: Has generalized muscle weakness.  NEUROLOGICAL: Has slurred speech, right-sided weakness from prior stroke. Seems to have some mild undiagnosed dementia.  PSYCHIATRIC: No anxiety, depression.   HOME MEDICATIONS:  1. Alprazolam 1 mg 4 times a day.  2. Aspirin 81 mg oral once a day.  3. Atorvastatin 80 mg oral once a day.  4. Finasteride 5 mg oral once a day.  5. Folic acid 0.4 mg oral once a day.  6. Lasix 40 mg oral once a day.  7. Allopurinol 1 mg oral once a  day.  8. Lamotrigine  200 mg oral 2 times a day.  9. Metoprolol succinate 50 mg oral once a day.  10. Pravastatin 40 mg oral once a day.  11. Plavix 75 mg oral once a day.  12. Ramipril 5 mg oral once a day.  13. Tamsulosin 0.4 mg oral once a day.  14. Tramadol 50 mg oral 3 times a day.  FAMILY HISTORY:  Reviewed and unknown.   PHYSICAL EXAMINATION: VITAL SIGNS: Temperature 97.6, pulse as low as 44, presently at 56, normal sinus rhythm, respirations 18, blood pressure 109/52, saturating 100% on room air.  GENERAL: Obese Caucasian male patient lying in bed, comfortable, conversational, cooperative with exam.  PSYCHIATRIC: Alert and oriented x 3, depressed mood, poor judgment.  HEENT: Atraumatic, normocephalic. Oral mucosa moist and pink. External ears and nose normal. No pallor. No icterus. Pupils bilaterally equal and reactive to light.  NECK: Supple, no thyromegaly. No carotid bruits heard. No cervical lymphadenopathy.  CARDIOVASCULAR: S1, S2, regular rate and rhythm without any murmurs. Peripheral pulses 1+ in lower extremities. No edema.  RESPIRATORY: Normal work of breathing, has bilateral rhonchi. Good air entry on both sides.  GASTROINTESTINAL: Soft abdomen, nontender. Bowel sounds present. No hepatosplenomegaly palpable.  SKIN: Warm and dry. No petechiae, rash, ulcers.  MUSCULOSKELETAL: No joint swelling, redness, effusion of the large joints. Normal muscle tone.  NEUROLOGICAL: Motor strength 5 out of 5 in left upper and lower extremities, 5- in right upper and lower extremities. Sensation to fine touch intact all over. Cranial nerves II through XII intact. No facial droop, has some slurred speech. Reflexes absent in bilateral lower extremities.   LABORATORY AND RADIOLOGICAL DATA:  Glucose 97, BUN 14, creatinine 0.89, sodium 134, potassium 3.3, chloride 100, bicarbonate 31, AST, ALT alkaline phosphatase, bilirubin and troponin normal. WBC 7.5, hemoglobin 14.4. Urinalysis shows no bacteria, no WBCs.   CT scan of the head without contrast shows chronic and involutional changes, area of encephalomalacia in the posterior temporoparietal region. No acute abnormalities found.  Chest x-ray shows no evidence of acute cardiopulmonary abnormality.  EKG shows normal sinus rhythm with incomplete right  bundle branch block, heart rate of 53. PR interval of 168. No acute ST-T wave changes.   ASSESSMENT AND PLAN: 1. Acute onset of slurred speech in a patient with prior stroke and significant carotid stenosis bilaterally:  We will admit the patient for a stroke workup. We will get an MRI, neuro checks every 4. We will consult neurology. The patient is already on an aspirin and Plavix. This will not be changed at this time. We will also consult vascular surgery, Dr. Delana Meyer, who has seen the patient for further recommendations on the carotid stenosis to see if the patient might need endarterectomy or even a possible transfer to another facility if the MRI shows acute stroke. I suspect the patient's symptomatology might also be secondary to having low normal blood pressure in the setting of significant carotid stenosis bilaterally. We will hold off on his blood pressure medications, and look for clinical improvement, and await MRI. We will also consult physical therapy.  2. Sinus bradycardia: The patient is on metoprolol, which will be stopped. His generalized weakness might be contributed from the bradycardia.  3. Tobacco abuse: I have counseled the patient for greater than 3 minutes to quit smoking, explained the risks with his prior existing CVA, coronary artery disease. The patient seems to be determined to quit smoking.  I reinforced this on multiple occasions during the  H and P.  4. Anxiety: The patient is on high doses of benzodiazepines. I will decrease the dose of benzodiazepines as these can cause generalized weakness, of which the patient is complaining, along with bradycardia. He needs to be monitored for any withdrawals.  5. Coronary artery disease: Stable.  6. Hypertension: Stop ramipril and metoprolol. The patient will need high normal blood pressures considering his carotid stenosis.  7. Deep vein thrombosis prophylaxis with heparin.  8. Hypokalemia:  Replace as needed.   CODE STATUS:  FULL  CODE.     TIME SPENT: Time spent today on this case was 65 minutes with more than 50% time spent in coordination of care.  ____________________________ Leia Alf. Gwen Edler, MD srs:cb D: 05/25/2012 15:41:42 ET T: 05/25/2012 15:57:49 ET JOB#: [id number]  cc: Alveta Heimlich R. Maveryck Bahri, MD, <Dictator> Katha Cabal, MD [name], MD Neita Carp MD ELECTRONICALLY SIGNED 05/27/2012 04:54

## 2014-09-27 NOTE — Op Note (Signed)
PATIENT NAME:  Melvin Krueger, Melvin Krueger MR#:  [id number] DATE OF BIRTH:  05-04-1951  DATE OF PROCEDURE:  10/24/2012  PREOPERATIVE DIAGNOSES:  1.  Atherosclerotic occlusive disease, bilateral lower extremities, with rest pain of bilateral lower extremities.  2.  Atherosclerotic occlusive disease, bilateral lower extremities, with stricture stenosis of iliac arteries.  3.  Severe symptomatic atherosclerotic occlusive disease of the left carotid artery system.   POSTOPERATIVE DIAGNOSES:  1.  Atherosclerotic occlusive disease, bilateral lower extremities, with rest pain of bilateral lower extremities.  2.  Atherosclerotic occlusive disease, bilateral lower extremities, with stricture stenosis of iliac arteries.  3.  Severe symptomatic atherosclerotic occlusive disease of the left carotid artery system.   PROCEDURES PERFORMED: 1.  Abdominal aortogram.  2.  Pelvic angiography.  3.  Percutaneous transluminal angioplasty and stent placement of the right external iliac artery.   SURGEON: Hortencia Pilar, M.D.   SEDATION:  Versed 5 mg plus fentanyl 200 mcg administered IV. Continuous ECG, pulse oximetry and cardiopulmonary monitoring is performed throughout the entire procedure by the interventional radiology nurse. Total sedation time was approximately 2 hours.   ACCESSES: 1.  6-French sheath, left common femoral artery.  2.  5-French sheath, right common femoral artery.   CONTRAST USED: Isovue 138 mL.   FLUORO TIME: 26.5 minutes.   INDICATIONS: Melvin Krueger is a 64 year old gentleman who presented to the office with increasing pain in his lower extremities. He states the left leg bothers him more than the right and noninvasive studies demonstrate severe atherosclerotic occlusive disease bilaterally. The risks and benefits for angiography with the hope for intervention were reviewed. All questions answered. The patient agrees to proceed.   DESCRIPTION OF PROCEDURE: The patient is taken to special  procedures and placed in the supine position. After adequate sedation is achieved, both groins are prepped and draped in sterile fashion. Lidocaine is infiltrated in the soft tissues on the right. Ultrasound is placed in a sterile sleeve. Common femoral artery is identified. It is echolucent, minimally pulsatile, but it does appear to be patent. Image is recorded for the permanent record. Under direct ultrasound visualization, a micropuncture needle is inserted, microwire is then advanced. Micro sheath is inserted followed by J-wire which does not advance more than approximately 15 cm, but this does allow for placement of a 5-French sheath. Hand injection of contrast demonstrates a dissection with a string sign noted. Previous films from the carotid angiography 6 to 8 months ago demonstrated similar findings, although the dissection was not as prominent. The string sign was noted.   Using multiple wires, including an Advantage wire, floppy glidewire, and Magic torque wire, as well as a KMP catheter, attempts at crossing into the common iliac and aortic system were unsuccessful and therefore the left groin is accessed.  Again, under direct ultrasound visualization, after 1% lidocaine, common femoral artery is identified. It is pulsatile and echolucent indicating patency. Image is recorded and the puncture is made with a micropuncture needle under direct visualization, microwire followed microsheath, J-wire followed by a 6-French sheath and 5-French pigtail catheter. The pigtail catheter is positioned at T12 and AP projection of the aorta is obtained. Pigtail catheter is repositioned to above the bifurcation and bilateral oblique views of the pelvis are obtained. Femoral views are then obtained. From the antegrade direction, the right external iliac appears identical to that imaged at the time of the carotid angiography with a string sign throughout essentially the entire length of the external. 3000 units of  heparin was given. An  additional 1000 units was given as well.  An Ansel high flex sheath is then advanced up and over the bifurcation using an Advantage wire and the pigtail catheter to advance the wire into the internal system. The sheath is then positioned with its tip at the origin of the external and the Advantage wire and KMP catheter are negotiated down into the common femoral where hand injection of contrast demonstrates luminal positioning. The Advantage wire is then advanced into the profunda to allow for appropriate purchase.   A 6 x 6 balloon is then used to angioplasty the external iliac.  Two separate inflations to 14 atmospheres are made. There is minimal improvement noted on followup imaging and therefore a 7 x 40 LifeStar stent and then an 8 x 40 LifeStar stent are deployed essentially covering the entire external iliac artery. The bottom is post dilated to 6 mm, at the level of the ilioinguinal ligament, and the top two thirds or more proximal two thirds are dilated to 7 mm.  Followup angiography demonstrates an excellent result with significant improvement in the flow of contrast into the profunda femoris. SFA appears occluded.   Oblique view of the groin is obtained via the 5-French sheath and subsequently a 5-French Mynx device is deployed under fluoroscopic guidance with success. Post deployment film demonstrates the common femoral artery on the right is patent and unchanged and there is no extravasation.   The Ansel sheath is then pulled into the left external iliac, oblique view of the left groin is obtained, and subsequently a ProGlide device is deployed without difficulty. There are no immediate complications.   INTERPRETATION: The abdominal aorta is opacified with a bolus injection of contrast. There are no hemodynamically significant stenoses noted within the aorta, in the infrarenal segment. Bilateral single renal arteries are noted with no evidence of significant renal artery  stenosis. The common iliac arteries are patent bilaterally. The internal iliac arteries are patent bilaterally, but diffusely diseased and somewhat diminished. The left external iliac artery is patent. It does not appear to have a hemodynamically significant lesion. However, near its distal aspect it does appear to have approximately a 40% stenosis. On the right, there is subtotal occlusion of the external with a string sign over the majority of this length of the artery. At the circumflex vessels, it returns to approximately a 6 mm diameter. There is diffuse disease within the common femoral and the profunda femoris, but they are patent on the right. SFA is occluded through the majority of its course.   On the left, the common femoral and profunda femoris are patent, but diffusely diseased. The SFA is patent in its proximal several centimeters, but then appears to occlude.   Following angioplasty and stent placement, there is now patency of the external iliac with significant improvement in flow into the right profundus system.  ____________________________ Katha Cabal, MD ggs:sb D: 10/24/2012 15:33:26 ET T: 10/24/2012 16:31:04 ET JOB#: [id number]  cc: Katha Cabal, MD, <Dictator> Katha Cabal MD ELECTRONICALLY SIGNED 10/31/2012 8:53

## 2014-09-27 NOTE — Discharge Summary (Signed)
PATIENT NAME:  Melvin Krueger, Melvin Krueger MR#:  [id number] DATE OF BIRTH:  03/19/1951  DATE OF ADMISSION:  07/18/2012 DATE OF DISCHARGE:  07/24/2012  ADMITTING PHYSICIAN: Dr. Laurin Coder.   PRIMARY CARE PHYSICIAN: Dr. Brunetta Genera.  DISCHARGING PHYSICIAN: Gladstone Lighter, MD.   [place]: Neurology consultation by Dr. Gurney Maxin.   DISCHARGE DIAGNOSES:  1.  Altered mental status on admission due to polypharmacy.  2.  History of cerebrovascular accident.  3.  Hypertension.  4.  Coronary artery disease.  5.  Bipolar with depression and anxiety.  6.  Right humeral fracture 3 weeks ago.  7.  Carotid artery stenosis, status post recent left carotid stent placement.   DISCHARGE HOME MEDICATIONS:  1.  Aspirin 81 mg p.o. daily.  2.  Folic acid 0.4 mg p.o. daily.  3.  Lamictal 200 mg 1 tablet p.o. b.i.d.  4.  Plavix 75 mg p.o. daily.  5.  Atorvastatin 80 mg p.o. at bedtime.  6.  Paroxetine 40 mg p.o. daily.  7.  Flomax 0.4 mg p.o. daily.  8.  Finasteride 5 mg p.o. daily.  9.  Lasix 20 mg p.o. daily.  10. Xanax 0.5 mg every 6 hours p.r.n. for anxiety.  11. Tramadol 50 mg p.o. every 6 hours p.r.n. for pain.  12. Lisinopril 10 mg p.o. daily.  13. Metoprolol 25 mg p.o. b.i.d.  14. Protonix 40 mg p.o. daily.  15. Nicotine patch daily 21 mg transdermal patch.   DISCHARGE DIET: Low sodium diet.   DISCHARGE ACTIVITY: As tolerated.    FOLLOWUP INSTRUCTIONS:  1.  Orthopedics followup at [facility] next week for right humerus fracture.  2.  PCP followup in 1 to 2 weeks.  3.  Physical therapy.   LABS AND IMAGING STUDIES: WBC 8.1, hemoglobin 12.4, hematocrit 36.8, platelet count 138. Sodium 137, potassium 3.3, chloride 103, bicarbonate 29, BUN 16, creatinine 0.71, glucose 185 and calcium of 8.3. Right humerus x-ray showing proximal humeral fracture, which is comminuted, slightly displaced and mild callus formation is present. CT of the head showing severe chronic  ischemic changes and atrophy. Urine cultures are negative. Blood cultures are negative. Troponins negative. Plasma ammonia is 26. CT on admission also showing chronic changes. Urinalysis negative for any infection. Urine tox screen positive for benzodiazepines and patient takes Xanax. Alcohol level was negative.   BRIEF HOSPITAL COURSE:  1.  The patient is a 63 year old Caucasian male with past medical history significant for history of CVA, TIA, recent carotid artery stent placement at [facility], who was at an assisted living facility, who was brought in for altered mental status. likely secondary to polypharmacy. The patient was on Haldol. some pain medications and also increased doses of Xanax when he came in. He did have a CT head, which was negative for any acute changes. He could not get the MRI done as initial diagnosis was possible transient ischemic attack because of recent carotid stent placement. He did not have any focal neurological changes and mental status cleared up in the next 24 hours. Repeat CT head also did not show any acute changes. After adjusting his medications, that is stopping the narcotic, decreasing the Xanax dose and stopping the Haldol,  the patient was normal at his baseline. He was alert and oriented. His medications are being changed at time of discharge.  2.  History of cerebrovascular accident. The patient on aspirin and Plavix. 3.  Carotid artery stenosis, status post recent stent placement and doing well. No focal  neurological changes on aspirin and Plavix at this time.  4.  Hypertension. The patient is on metoprolol and lisinopril currently.  5.  Depression and anxiety. Continue his home medications of Lamictal and also Xanax. The dose has been introduced.  6.  Benign prosthetic hypertrophy, on Flomax and also finasteride.  7.  Right humerus fracture about 2 weeks ago, seen at [facility] as an outpatient. Repeat x-ray showed the same comminuted fracture  of right proximal humerus with some displacement and mild callus formation. The patient has an appointment at [facility] ortho next week, which he is otherwise to keep. The patient requiring higher level of care when physical therapy evaluated here and not safe to go back to assisted living facility. They recommended short-term rehab and per family the patient has chosen [facility] at this time. The patient will be discharged to [facility].   DISCHARGE CONDITION: Stable.   DISCHARGE DISPOSITION: To [facility] rehab facility.  TIME SPENT: 35 minutes.    ____________________________ Gladstone Lighter, MD rk:aw D: 07/23/2012 14:44:47 ET T: 07/24/2012 09:09:13 ET JOB#: [id number]  cc: Gladstone Lighter, MD, <Dictator> Gladstone Lighter MD ELECTRONICALLY SIGNED 08/02/2012 15:59

## 2014-09-27 NOTE — H&P (Signed)
PATIENT NAME:  Melvin Krueger, Melvin Krueger MR#:  [id number] DATE OF BIRTH:  1951-05-07  DATE OF ADMISSION:  07/18/2012  REFERRING PHYSICIAN:  Marta Antu, MD  PRIMARY CARE PHYSICIAN:  Meindart A. Brunetta Genera, MD.    CHIEF COMPLAINT:  Altered mental status.    HISTORY OF PRESENT ILLNESS:  The patient is a very nice 64 year old gentleman with history of coronary artery disease, previous stroke, and peripheral artery disease with bilateral carotid artery stenosis, who comes today with a change of mental status within the last 12 hours.  The patient had some procedure done, apparently a carotid stent of the left carotid artery, which he was previously evaluated with 70% stenosis for what the patient was referred to [facility] where he got the procedure done.  The procedure was done without complications and he was in his normal state of health.  The family really has a lot of concerns about care done at the assisted living facility where they think he has been getting the wrong medications and occasionally missing some of the medications.  The patient was discharged on Plavix and aspirin and they are not quite sure if he has been given those medications.  The patient comes today mostly confused, having some hallucinations.  He is overall bed-bounded and he needs help with getting re-positioned and transferred from the bed to the chair or transferred bedside commode, but he cannot walk. There are no major changes on his physical exam or slurred speech.  There may be an increase of the droop of his left oral commissure and his altered mental status and confusion.  Patient had a CT scan of the head that actually had some acute changes.  It was reported as chronic and involutional changes, evidence of acute abnormalities.  The patient had a posterior parietal occipital low attenuation consistent with encephalomalacic changes, which are likely due to ischemia, which in that case will be acute, but he also has diffuse cortical  atrophy and some other subcortical abnormalities.  They are more chronic.  There was no sign of bleeding.  The patient is admitted for treatment of the condition.  He is going to have a Neurologic consultation in the morning.    REVIEW OF SYSTEMS: Unable to obtain review of systems from the patient, since the patient has altered mental status and confusion.  From the family, they can tell me that he has no fever, no changes in his weight, no changes on his vision as far as they can tell, and no difficulty swallowing.  He has not had problems with food, but he has very slurred speech and he has not eaten much today.  There has not been any cough, any problems with his chest, like chest pain.  No nausea, vomiting.  No changes in his bowels.  No changes on his urinary frequency.  Other than that, I am not able to get any other information from the patient as far as his review of systems.   PAST MEDICAL HISTORY: 1.  Coronary artery disease.  2.  Hypertension.  3.  CVA 8 years ago.   4.  On last discharge, the patient was diagnosed with TIA.   5.  Carotid artery stenosis.  6.  Significant dizziness.  7.  Cataracts.   ALLERGIES:  PENICILLIN.    PAST SURGICAL HISTORY:  1.  Coronary artery bypass graft.  2.  Stent of the left carotid artery.   3.  Family denies any other surgical interventions.    SOCIAL HISTORY:  Patient is a smoker.  Smokes 1 pack a day.  He has not been smoking lately, as far as they can tell me, but they do not know when was his last cigarette.  He does not drink.  No history of drug abuse.  He gets around in a motorized wheelchair and he is in an assisted living facility.  He requires one-to-one care assistance with mobilization, feeding and transfers.    FAMILY HISTORY:  Positive for coronary artery disease, that is from family members, who give me that history.   CURRENT MEDICATIONS: Include:   1.  Aspirin 81 mg a day.  2.  Folic acid 0.4 mg daily.   3.  Lamotrigine 200 mg  twice daily.  4.  Plavix 75 mg daily.  5.  Alprazolam 1 mg 4 times daily.  6.  Tramadol 50 mg 3 times daily.  7.  Atorvastatin 80 mg at bedtime.  8.  Allopurinol 1 mg daily.  5.  Plavix 40 mg daily.  6.  Flomax 0.4 mg daily.   7.  Finasteride 5 mg daily.  8.  Lasix 20 mg daily.  9.  The patient apparently was not taking Ramipril and metoprolol.   PHYSICAL EXAMINATION: VITAL SIGNS:  Blood pressure 149/72, pulse 78, respirations 22, temperature 97.6. GENERAL:  The patient is alert at the moment of interview, but he has been very lethargic all day.  He is lying down in bed, looks chronically ill.  HEENT:  His pupils are equal and reactive.  Extraocular movements are intact.  Mucosa is actually moist.  No oral lesions or oropharyngeal exudates.   NECK: Supple.  No JVD. No thyromegaly.  No adenopathy.  I cannot hear any carotid bruits.  There is no neck rigidity. CARDIOVASCULAR:  Regular rate and rhythm.  No murmurs, rubs or gallops.  No tenderness to palpation of anterior chest wall.  No displacement of PMI.   LUNGS:  Clear without any wheezing or crepitus.  No use of accessory muscles.  ABDOMEN:  Soft, nontender, nondistended.  No hepatosplenomegaly, no masses.  Bowel sounds are positive.  GENITAL:  Negative for external lesions.  EXTREMITIES:  No significant cyanosis or clubbing.  Pitting edema +1.  Dry skin of the lower extremities with changes of chronic venous insufficiency.  Pulses +2.  Capillary refill less than 3.   NEUROLOGIC:  Cranial nerves seem grossly intact.  There is though some chronic droop of the left side of mouth, which at this moment apparently is increased as per the family.  Patient is dysarthric.  His speech is not goal oriented.  He is very confused.  He has right-side hemiparesis with decreased tone and decreased DTRs.  The left side has a strength which is about 4/5 on the upper and lower extremities.  His tone is normal and his DTRs are +1.  Sensation is decreased on the  right side of his body, normal on the left side.   PSYCHIATRIC: The patient is not anxious.  The patient is very confused and having some hallucinations and non-goal-directed speech.   LYMPHATIC:  Negative for lymphadenopathy in the neck or supraclavicular areas.   MUSCULOSKLETAL:  No significant joint deformities.  No contractures.    LABORATORY, DIAGNOSTIC AND RADIOLOGIC DATA:  CT of the head, as mentioned above.    White count is 7000, hemoglobin is 13, platelets 158.  UA within normal limits.  ABG shows a slightly decrease in pCO2 of 49, pH 7.41, glucose 84, creatinine 0.93.  Electrolytes were within normal limits.  LFTs are slightly elevated, alkaline phosphatase of 137 and an AST which is low at 12.  Urine drug screen is positive for benzos, negative for opioids.  The patient received 1 dose of Narcan and it improved his changes of mental status, although he is negative for benzos.    EKG:  Normal sinus rhythm without any ST depressions or elevations.  No LVH.    ASSESSMENT AND PLAN:  A 64 year old gentleman with history of coronary artery disease, peripheral vascular disease, carotid artery disease status post stent placement on the left side.  History of transient ischemic attack in December.  The patient was already on Plavix and aspirin.  His medications were not changed, other than stopping his blood pressure medications.  Patient comes today with altered mental status and acute changes on his CT scan that might be related to a beginning of a stroke.   1.  Possible cerebrovascular accident.  The patient has some possible ischemic changes of the brain.  We are now going to order an MRI of the brain to rule out a full stroke.  He has a diagnosis of transient ischemic attacks in the past and he was kept on aspirin and Plavix.  At this moment, I think that he is a poor candidate for anticoagulation with Coumadin if that is the case, but there is no certainty if the patient is being given those  medications or not.  He has been taking blood pressure medications to prevent transient ischemic attacks and apparently they have not been given to him in the facility, although the family has doubts about this facility giving him the right medications.  2.  Metabolic encephallopaty of altered mental status.  This could be related to infectious processes like pneumonia or urinary tract infection.  His chest x-ray is pretty normal and his urinalysis did not show any signs of infection.  His white count is negative at 7.0 and he is negative for opioids on his UDS, he only positive for benzos, which we are going to continue only on an as-needed basis.  Apparently the patient was given this as scheduled.  We are going to hold on any medications that would be sedative.  We are going to do neurologic checks every 4 hours.  The patient probably needs a different placement after he is discharged from here.   3.  Hypertension.  Allow permissive hypertension unless his blood pressure is above 947 systolic.   4.  Peripheral vascular disease. The patient is status post carotid artery stent.  I do not think there are any significant problems with blockage, although I cannot rule out the possibility, for what I am going to get an MRI tomorrow to see if there is any expanding area of ischemia.  5.  Dyslipidemia, continue Lipitor.   6.  The patient is a full code. I spent about 45 minutes with this admission.  Patient is a smoker.  Tried to do some counseling, although patient is too confused to understand.   7.  For deep vein thrombosis prophylaxis, we are going to do for now POCs, encouraged to add on heparin tomorrow morning.   ____________________________ Maunaloa Sink, MD rsg:cc D: 07/18/2012 22:16:01 ET T: 07/18/2012 22:29:59 ET JOB#: [id number]  cc: Wade Hampton Sink, MD, <Dictator> Kerron Sedano America Brown MD ELECTRONICALLY SIGNED 07/26/2012 14:25

## 2014-09-27 NOTE — Discharge Summary (Signed)
PATIENT NAME:  Melvin Krueger, Melvin Krueger MR#:  [id number] DATE OF BIRTH:  08-Dec-1950  DATE OF ADMISSION:  05/25/2012 DATE OF DISCHARGE:  05/26/2012  PRIMARY CARE PHYSICIAN:  [name], MD   FINAL DIAGNOSES:   1.  Speech disturbance, probably related to low blood pressure and bradycardia, possible transient ischemic attack.  2.  History of cerebrovascular accident.  3.  Carotid stenosis.  4.  Dizziness and otitis media.  5.  History of coronary artery disease.   MEDICATIONS ON DISCHARGE:   1.  Aspirin 81 mg daily.  2.  Folic acid 0.4 mg daily.  3.  Lamotrigine 200 mg twice a day.  4.  Plavix 75 mg daily.  5.  Alprazolam 1 mg 4 times a day.  6.  Tramadol 50 mg 3 times a day.  7.  Atorvastatin 80 mg at bedtime.  8.  Haloperidol 1 mg daily.  9.  Paxil 40 mg daily.  10.  Flomax 0.4 mg daily.  11.  Finasteride 5 mg daily.  12.  Lasix decreased to 20 mg daily.  13.  Azithromycin 250 mg daily for 4 more days then stop.  14.  Stop taking ramipril and metoprolol.   HOME HEALTH:  PT and occupational therapy and also home health R.N.   DIET:  Low sodium diet, regular consistency.   FOLLOWUP:  In 1 to 2 weeks with Dr. Brunetta Genera.   HOSPITAL COURSE:  The patient was admitted on May 25, 2012 as an observation and discharged on May 26, 2012. He came in with slurred speech and generalized weakness.   HISTORY OF PRESENT ILLNESS: This is a 64 year old man with prior history of cerebrovascular accident, right left-sided weakness, hypertension, CABG, critical carotid artery stenosis bilaterally who presents to the Emergency Room with 3 days of slurred speech. The patient has been unable to ambulate over 2 years. He has critical stenosis on both sides of the carotids. He was admitted with acute onset slurred speech. The patient was admitted as an observation for stroke workup, could also be secondary to the patient's blood pressure being low on presentation and bradycardia. The patient's  metoprolol and ramipril were stopped. For his tobacco abuse, he was counseled on smoking cessation.   LABORATORY, DIAGNOSTIC AND RADIOLOGICAL DATA:  During the hospital course included a chest x-ray that showed no evidence of cardiopulmonary disease. CT scan of the head showed chronic and involutional changes. Urinalysis is negative. Troponin is negative. INR is 0.9. White blood cell count 7.5, hemoglobin and hematocrit 14.4 and 41.3 and platelet count 205. Glucose is 97, BUN 14, creatinine 0.89, sodium 134, potassium 3.3, chloride 100, CO2 31, calcium 8.9. Liver function tests normal. LDL is 23, HDL 23, triglycerides 212. Potassium upon discharge is 4.2. Echo showed no emboli, EF greater than 55%. MRI of the brain without contrast showed no severe involutional changes, chronic infarction with encephalomalacia in the left temporal parietal region, no evidence of acute abnormalities. Since the MRI did not show a stroke, the patient was discharged back to his facility.   HOSPITAL COURSE PER PROBLEM LIST:  1.  With the patient's speech disturbance, it is probably related to low blood pressure and bradycardia and possibility of TIA. The patient is already on aspirin and Plavix. No further treatments given for his stroke. His metoprolol and ramipril were stopped. Blood pressure upon discharge was 118/70, heart rate 59.  2.  For the patient's history of CVA, he is on aspirin and Plavix.  3.  For his  carotid stenosis, this is already known. Can follow up with vascular surgery as outpatient, but I doubt anything will be done for this.  4.  For the patient's dizziness, I looked in the patient's left ear and it was erythematous. It does look like an otitis media. I will give the patient a course of Zithromax.  5.  History of coronary artery disease, aspirin and Plavix only. Beta-blocker stopped secondary to bradycardia.   TIME SPENT ON DISCHARGE:  35 minutes.     ____________________________ Tana Conch.  Leslye Peer, MD rjw:si D: 05/26/2012 17:10:52 ET T: 05/28/2012 16:07:29 ET JOB#: [id number]  cc: Tana Conch. Leslye Peer, MD, <Dictator> [name], MD Marisue Brooklyn MD ELECTRONICALLY SIGNED 06/08/2012 12:39

## 2014-09-27 NOTE — Consult Note (Signed)
Referring Physician:  James Ivanoff, Roselie Awkward :   Primary Care Physician:  Lorelee Market : [facility], [place] [address], [place], [place], [place] [address], [place] [phone]  Reason for Consult: Admit Date: 18-Jul-2012  Chief Complaint: AMS, possible stroke.  Reason for Consult: altered mental status; CVA   History of Present Illness: History of Present Illness:   History gathered from prior notes and speaking with care providers due to patient AMS. OF PRESENT ILLNESS:  man with a history of CAD, PAD, bilateral CAS and stroke presented to [facility] yesterday with AMS.  He has recently had a carotid artery stent placed at [facility] which apparently was uncomplicated.  He was ultimately discharged on ASA and plavix.  Since that time, the patient has become confused and is having hallucinations.  There have been no new focal neurologic deficits besides the AMS besides a possible mild facial droop but it is uncertain if this is new or not.  On admission he had a HCT that showed old posterior occipital encephalomalacic changes without bleed.  Rather than go for MRI he has gone for a repeat HCT which is also negative for acute stroke.  He continues to be altered slightly but has dramatically improved since admission.  His symptoms are of moderate severity.  The symptoms are constant.  Nothing in particular brought them on although there is some noted concern amongst the family members that the patient may not have been receiving his medications appropriately although it is uncertain how this concern originated.  There have been no fevers or seizure like activities.       OF SYSTEMS: Unable to obtain review of systems from patient due to AMS. MEDICAL HISTORY: Coronary artery disease.  Hypertension.  CVA 8 years ago.   On last discharge, the patient was diagnosed with TIA.   Carotid artery stenosis.  Significant dizziness.  Cataracts.  SURGICAL HISTORY:  Coronary artery  bypass graft.  Stent of the left carotid artery.   Family denies any other surgical interventions.   HISTORY:  Smokes 1 pack a day.  He does not use alcohol.  No drug abuse.  He uses a motorized wheelchair and lives in an assisted living facility.  He requires one-to-one assist for with mobilization, feeding and transfers.   HISTORY:  Positive for coronary artery disease, that is from family members, who give me that history.  MEDICATIONS: Include:   Aspirin 81 mg a day.  Folic acid 0.4 mg daily.   Lamotrigine 200 mg twice daily.  Plavix 75 mg daily.  Alprazolam 1 mg 4 times daily.  Tramadol 50 mg 3 times daily.  Atorvastatin 80 mg at bedtime.  Allopurinol 1 mg daily.  Plavix 40 mg daily.  Flomax 0.4 mg daily.   Finasteride 5 mg daily.  Lasix 20 mg daily.  He was apparently not taking Ramipril and metoprolol at the facility.   ALLERGIES:  PENICILLIN.    GENERAL: NAD.  Normocephalic and atraumatic. exam shows normal disc size, appearance and C/D ratio without clear evidence of papilledema. and S2 sounds are within normal limits, without murmurs, gallops, or rubs. - Obese- NormalDrift - Absent in the LUE.  RUE limited due to RUE fracture.- Gait and station are not tested due to patient on fall precautions.  2/5    Shoulder abduction (deltoid/supraspinatus, axillary/suprascapular n, C5) 2/5    Elbow flexion (biceps brachii, musculoskeletal n, C5-6) 2/5    Elbow extension (triceps, radial n, C7) 2/5  Finger adduction (interossei, ulnar n, T1)  4/5    Hip flexion (iliopsoas, L1/L2) 4/5    Knee flexion (hamstrings, sciatic n, L5/S1) 4/5    Knee extension (quadriceps, femoral n, L3/4) 4/5    Ankle dorsiflexion (tibialis anterior, deep fibular n, L4/5)   Ankle plantarflexion (gastroc, tibial n, S1) STATUS:is oriented to person, place and time.  Recent and remote memory are both slightly decreased.  Attention span and concentration are intact.  Naming, repetition, comprehension and expressive speech  are within normal limits.  Patient's fund of knowledge is within normal limits for educational level. NERVES:   CN II (normal visual acuity and visual fields)   CN III, IV, VI (extraocular muscles are intact)   CN V (facial sensation is intact bilaterally)   CN VII (facial strength is intact bilaterally)   CN VIII (hearing is intact bilaterally)   CN IX/X (palate elevates midline, normal phonation)   CN XI (shoulder shrug strength is normal and symmetric)   CN XII (tongue protrudes midline) to pain and temp bilaterally (spinothalamic tracts)to position and vibration bilaterally (dorsal columns)    Biceps   Brachioradialis    Patellar   Achilles to nose testing is within normal limits.  64 year old man with a history of CAD, PAD, bilateral CAS and stroke presented to [facility] yesterday with AMS.  The HCT appears to show old changes, encephalomalacia occurs many months after a stroke.  I do not suspect that his AMS is related to his prior stroke.  Upon repeat HCT there is no acute stroke identified.  I have personally viewed his HCTs and discussed the results with him today.  I have also discussed the results of his tests and labs as outlined below.  I have noted that his AMS has gradually improved.  EEG could be considered if his AMS does not get back to baseline.  Upon additional discussion with teh patient and his friend in the room it seems that his right sided weakness is a chronic problem likely the result of his old, prior stroke.  He should stay on ASA 81 and Plavix for secondary stroke prevention.  There is no evidence of afib via telemetry or EKG to date. Melrose Nakayama, MD    ROS:  Extremities pain   Past Medical/Surgical Hx:  carotid stenosisw:   CAD:   htn:   CVA:   CABG:   Home Medications: Medication Instructions Last Modified Date/Time  aspirin 81 mg oral tablet 1 tab(s) orally once a day [id number] 73:41  folic acid 0.4 mg oral tablet 1 tab(s) orally once a day 11-Feb-14 17:37   lamotrigine 200 mg oral tablet 1 tab(s) orally 2 times a day 11-Feb-14 17:37  Plavix 75 mg oral tablet 1 tab(s) orally once a day 11-Feb-14 17:37  alprazolam 1 mg oral tablet 1 tab(s) orally 4 times a day 11-Feb-14 17:37  tramadol 50 mg oral tablet 1 tab(s) orally 3 times a day 11-Feb-14 17:37  atorvastatin 80 mg oral tablet 1 tab(s) orally once a day (at bedtime) 11-Feb-14 17:37  haloperidol 1 mg oral tablet 1 tab(s) orally once a day 11-Feb-14 17:37  paroxetine 40 mg oral tablet 1 tab(s) orally once a day 11-Feb-14 17:37  tamsulosin 0.4 mg oral capsule 1 cap(s) orally once a day 11-Feb-14 17:37  finasteride 5 mg oral tablet 1 tab(s) orally once a day 11-Feb-14 17:37  furosemide 20 mg oral tablet 1 tab(s) orally once a day 11-Feb-14 17:37   [facility] Current Meds:  Sodium  Chloride 0.9%, 1000 ml at 75 ml/hr  Acetaminophen * tablet, ( Tylenol (325 mg) tablet)  650 mg Oral q4h PRN for pain or temp. greater than 100.4  - Indication: Pain/Fever  Ondansetron injection, ( Zofran injection )  4 mg, IV push, q4h PRN for Nausea/Vomiting  Indication: Nausea/ Vomiting  Pantoprazole tablet, 40 mg Oral q6am  - Indication: Erosive Esophagitis/ GERD  Instructions:  DO NOT CRUSH  Promethazine injection, ( Phenergan injection )  6.25 to 12.5 mg, IV push, q4h PRN for nausea, vomiting  Indication: Antiemetic/ Motion Sickness/ Sedative/ Antihistamine  Aspirin Chewable, 81 mg Oral daily  - Indication: Pain/Fever/Thromboembolic Disorders/Post MI/Prophylaxis MI  Finasteride tablet, ( Proscar)  5 mg Oral daily  - Indication: Benign Prostatic Hyperplasia  atorvaSTATin tablet, 80 mg Oral at bedtime  - Indication: Hypercholesterolemia  Clopidogrel tablet, 75 mg Oral daily  Instructions:  Initiate Bleeding Precautions Protocol  lamoTRIgine tablet, ( LaMICtal)  200 mg Oral bid  - Indication: Seizures/Mood Stabilizer  PARoxetine tablet, ( Paxil)  40 mg Oral daily  - Indication: Depression/ Panic/  OCD  Tamsulosin capsule, ( Flomax)  0.4 mg Oral at bedtime  - Indication: Symptoms of Benign Prostatic Hyperplasma  ALPRAZolam tablet, 0.25 mg Oral q8h PRN for anxiety  - Indication: Anxiety/ Depression/ Panic  Instructions:  for anxiety  Folic Acid tablet, ( Folate)  400 mcg Oral daily  - Indication: Folic Acid Deficiency  Furosemide tablet, ( Lasix)  20 mg Oral daily  - Indication: Diuresis  HePARin injection, 5000 unit(s), Subcutaneous, q12h  Indication: Anticoagulant, Monitor Anticoags per hospital protocol  Allergies:  Penicillin: Hives  Vital Signs: **Vital Signs.:   12-Feb-14 04:20  Vital Signs Type Routine  Temperature Temperature (F) 97.5  Celsius 36.3  Temperature Source oral  Pulse Pulse 57  Systolic BP Systolic BP 270  Diastolic BP (mmHg) Diastolic BP (mmHg) 85  Mean BP 113  Pulse Ox % Pulse Ox % 96  Pulse Ox Activity Level  At rest  Oxygen Delivery Room Air/ 21 %    08:00  Vital Signs Type Routine  Temperature Temperature (F) 98.5  Celsius 36.9  Temperature Source oral  Pulse Pulse 73  Respirations Respirations 18  Systolic BP Systolic BP 350  Diastolic BP (mmHg) Diastolic BP (mmHg) 73  Mean BP 106  Pulse Ox % Pulse Ox % 98  Pulse Ox Activity Level  At rest  Oxygen Delivery Room Air/ 21 %    11:12  Vital Signs Type Routine  Temperature Temperature (F) 98.3  Celsius 36.8  Temperature Source oral  Pulse Pulse 66  Respirations Respirations 18  Systolic BP Systolic BP 093  Diastolic BP (mmHg) Diastolic BP (mmHg) 78  Mean BP 96  Pulse Ox % Pulse Ox % 95  Pulse Ox Activity Level  At rest  Oxygen Delivery Room Air/ 21 %    15:18  Vital Signs Type Routine  Temperature Temperature (F) 98  Celsius 36.6  Temperature Source oral  Pulse Pulse 85  Respirations Respirations 18  Systolic BP Systolic BP 818  Diastolic BP (mmHg) Diastolic BP (mmHg) 70  Mean BP 91  Pulse Ox % Pulse Ox % 96  Pulse Ox Activity Level  At rest  Oxygen Delivery Room Air/ 21  %    20:33  Vital Signs Type Routine  Temperature Temperature (F) 97.4  Celsius 36.3  Temperature Source oral  Pulse Pulse 67  Respirations Respirations 18  Systolic BP Systolic BP 299  Diastolic BP (mmHg) Diastolic BP (  mmHg) 73  Mean BP 94  Pulse Ox % Pulse Ox % 95  Pulse Ox Activity Level  At rest  Oxygen Delivery Room Air/ 21 %   Lab Results:  Hepatic:  11-Feb-14 17:31   Bilirubin, Total 0.3  Alkaline Phosphatase  137  SGPT (ALT) 13  SGOT (AST)  12  Total Protein, Serum 6.6  Albumin, Serum 3.9  Lab:  11-Feb-14 18:20   pH (ABG) 7.41  PCO2  49  PO2  75  FiO2 21  Base Excess  5.3  HCO3  31.1  O2 Saturation 97.5  O2 Device room  Specimen Site (ABG) LT RADIAL  Specimen Type (ABG) ARTERIAL  Patient Temp (ABG) 37.0 (Result(s) reported on 18 Jul 2012 at 06:36PM.)  Routine Chem:  11-Feb-14 17:31   Potassium, Serum 3.5  Glucose, Serum 84  BUN 12  Creatinine (comp) 0.93  Sodium, Serum 137  Chloride, Serum 103  CO2, Serum 28  Calcium (Total), Serum 8.6  Anion Gap  6  Osmolality (calc) 273  eGFR (African American) >60  eGFR (Non-African American) >60 (eGFR values <26m/min/1.73 m2 may be an indication of chronic kidney disease (CKD). Calculated eGFR is useful in patients with stable renal function. The eGFR calculation will not be reliable in acutely ill patients when serum creatinine is changing rapidly. It is not useful in  patients on dialysis. The eGFR calculation may not be applicable to patients at the low and high extremes of body sizes, pregnant women, and vegetarians.)  Ethanol, S. < 3  Ethanol % (comp) < 0.003 (Result(s) reported on 18 Jul 2012 at 06:32PM.)    18:37   Ammonia, Plasma 26 (Result(s) reported on 18 Jul 2012 at 07:10PM.)  12-Feb-14 01:05   Potassium, Serum  3.2  Glucose, Serum 90  BUN 9  Creatinine (comp) 0.84  Sodium, Serum 143  Chloride, Serum  108  CO2, Serum 31  Calcium (Total), Serum 9.1  Anion Gap  4  Osmolality (calc) 283   eGFR (African American) >60  eGFR (Non-African American) >60 (eGFR values <637mmin/1.73 m2 may be an indication of chronic kidney disease (CKD). Calculated eGFR is useful in patients with stable renal function. The eGFR calculation will not be reliable in acutely ill patients when serum creatinine is changing rapidly. It is not useful in  patients on dialysis. The eGFR calculation may not be applicable to patients at the low and high extremes of body sizes, pregnant women, and vegetarians.)  Magnesium, Serum 2.0 (1.8-2.4 THERAPEUTIC RANGE: 4-7 mg/dL TOXIC: > 10 mg/dL  -----------------------)  Urine Drugs:  [id number] Tricyclic Antidepressant, Ur Qual (comp) NEGATIVE (Result(s) reported on 18 Jul 2012 at 07:01PM.)  Amphetamines, Urine Qual. NEGATIVE  MDMA, Urine Qual. NEGATIVE  Cocaine Metabolite, Urine Qual. NEGATIVE  Opiate, Urine qual NEGATIVE  Phencyclidine, Urine Qual. NEGATIVE  Cannabinoid, Urine Qual. NEGATIVE  Barbiturates, Urine Qual. NEGATIVE  Benzodiazepine, Urine Qual. POSITIVE (----------------- The URINE DRUG SCREEN provides only a preliminary, unconfirmed analytical test result and should not be used for non-medical  purposes.  Clinical consideration and professional judgment should be  applied to any positive drug screen result due to possible interfering substances.  A more specific alternate chemical method must be used in order to obtain a confirmed analytical result.  Gas chromatography/mass spectrometry (GC/MS) is the preferred confirmatory method.)  Methadone, Urine Qual. NEGATIVE  Cardiac:  11-Feb-14 17:31   Troponin I < 0.02 (0.00-0.05 0.05 ng/mL or less: NEGATIVE  Repeat testing in 3-6 hrs  if clinically indicated. >0.05 ng/mL: POTENTIAL  MYOCARDIAL INJURY. Repeat  testing in 3-6 hrs if  clinically indicated. NOTE: An increase or decrease  of 30% or more on serial  testing suggests a  clinically important change)  12-Feb-14 01:05    Troponin I < 0.02 (0.00-0.05 0.05 ng/mL or less: NEGATIVE  Repeat testing in 3-6 hrs  if clinically indicated. >0.05 ng/mL: POTENTIAL  MYOCARDIAL INJURY. Repeat  testing in 3-6 hrs if  clinically indicated. NOTE: An increase or decrease  of 30% or more on serial  testing suggests a  clinically important change)    09:43   Troponin I < 0.02 (0.00-0.05 0.05 ng/mL or less: NEGATIVE  Repeat testing in 3-6 hrs  if clinically indicated. >0.05 ng/mL: POTENTIAL  MYOCARDIAL INJURY. Repeat  testing in 3-6 hrs if  clinically indicated. NOTE: An increase or decrease  of 30% or more on serial  testing suggests a  clinically important change)  Routine UA:  11-Feb-14 17:31   Color (UA) Straw  Clarity (UA) Clear  Glucose (UA) Negative  Bilirubin (UA) Negative  Ketones (UA) Negative  Specific Gravity (UA) 1.005  Blood (UA) Negative  pH (UA) 6.0  Protein (UA) Negative  Nitrite (UA) Negative  Leukocyte Esterase (UA) Negative (Result(s) reported on 18 Jul 2012 at [facility].)  RBC (UA) <1 /HPF  WBC (UA) NONE SEEN  Bacteria (UA) NONE SEEN  Epithelial Cells (UA) <1 /HPF (Result(s) reported on 18 Jul 2012 at [facility].)  Routine Coag:  11-Feb-14 17:31   Activated PTT (APTT) 30.1 (A HCT value >55% may artifactually increase the APTT. In one study, the increase was an average of 19%. Reference: "Effect on Routine and Special Coagulation Testing Values of Citrate Anticoagulant Adjustment in Patients with High HCT Values." American Journal of Clinical Pathology 2006;126:400-405.)  Prothrombin 13.2  INR 1.0 (INR reference interval applies to patients on anticoagulant therapy. A single INR therapeutic range for coumarins is not optimal for all indications; however, the suggested range for most indications is 2.0 - 3.0. Exceptions to the INR Reference Range may include: Prosthetic heart valves, acute myocardial infarction, prevention of myocardial infarction, and combinations of aspirin and  anticoagulant. The need for a higher or lower target INR must be assessed individually. Reference: The Pharmacology and Management of the Vitamin K  antagonists: the seventh ACCP Conference on Antithrombotic and Thrombolytic Therapy. EPPIR.5188 Sept:126 (3suppl): [id number]. A HCT value >55% may artifactually increase the PT.  In one study,  the increase was an average of 25%. Reference:  "Effect on Routine and Special Coagulation Testing Values of Citrate Anticoagulant Adjustment in Patients with High HCT Values." American Journal of Clinical Pathology 2006;126:400-405.)  Routine Hem:  11-Feb-14 17:31   WBC (CBC) 7.0  RBC (CBC)  4.09  Hemoglobin (CBC) 13.3  Hematocrit (CBC)  38.6  Platelet Count (CBC) 158 (Result(s) reported on 18 Jul 2012 at 05:42PM.)  MCV 94  MCH 32.5  MCHC 34.4  RDW 14.0  12-Feb-14 01:05   WBC (CBC) 8.8  RBC (CBC) 4.65  Hemoglobin (CBC) 15.2  Hematocrit (CBC) 45.1  Platelet Count (CBC) 150  MCV 97  MCH 32.6  MCHC 33.7  RDW 14.4  Neutrophil % 72.4  Lymphocyte % 17.5  Monocyte % 6.5  Eosinophil % 3.0  Basophil % 0.6  Neutrophil # 6.3  Lymphocyte # 1.5  Monocyte # 0.6  Eosinophil # 0.3  Basophil # 0.1 (Result(s) reported on 19 Jul 2012 at 01:40AM.)   Radiology Results: CT:  11-Feb-14 18:51, CT Head Without Contrast  CT Head Without Contrast   REASON FOR EXAM:    ams  COMMENTS:       PROCEDURE: CT  - CT HEAD WITHOUT CONTRAST  - Jul 18 2012  6:51PM     RESULT: Head CT dated 07/18/2012    Technique: Helical noncontrasted 5 mm sections were obtained from skull   base the vertex.    Findings: An area of low-attenuation project in the posterior parietal   occipital region on the left consistent with encephalomalacic change,   likely of ischemic etiology. There is diffuse cortical atrophy as well as   areas of diffuse low attenuation within the subcortical, deep, and   periventricular white matter regions. There is no evidence of acute      hemorrhage, mass effect, nor a depressed skull fracture. The visualized   paranasal sinuses and mastoid air cells are patent.    IMPRESSION:  Chronic and involutional changes evidence of acute   abnormalities.        Verified By: Mikki Santee, M.D., MD   Electronic Signatures: Anabel Bene (MD)  (Signed 15-Feb-14 00:20)  Authored: REFERRING PHYSICIAN, Primary Care Physician, Consult, History of Present Illness, Review of Systems, PAST MEDICAL/SURGICAL HISTORY, HOME MEDICATIONS, Current Medications, ALLERGIES, NURSING VITAL SIGNS, LAB RESULTS, RADIOLOGY RESULTS   Last Updated: 15-Feb-14 00:20 by Anabel Bene (MD)

## 2014-09-30 LAB — SURGICAL PATHOLOGY

## 2014-10-01 ENCOUNTER — Ambulatory Visit
Admit: 2014-10-01 | Disposition: A | Discharge status: Home / Self Care | Payer: Self-pay | Attending: Vascular Surgery | Admitting: Vascular Surgery

## 2014-10-01 LAB — CREATININE, SERUM
Creatinine: 0.96 mg/dL
EGFR (African American): >60
EGFR (Non-African Amer.): >60

## 2014-10-01 LAB — BUN: BUN: 21 mg/dL — AB

## 2014-10-06 NOTE — Op Note (Signed)
PATIENT NAME:  Melvin Krueger, Melvin Krueger MR#:  [id number] DATE OF BIRTH:  1950/06/26  DATE OF PROCEDURE:  10/01/2014  PREOPERATIVE DIAGNOSES:  1. Atherosclerotic occlusive disease, bilateral lower extremities, with claudication and leg pain symptoms.  2. Complication of vascular device with stent restenosis.  3. Gait disturbance.   POSTOPERATIVE DIAGNOSES:   1. Atherosclerotic occlusive disease, bilateral lower extremities, with claudication and leg pain symptoms.  2. Complication of vascular device with stent restenosis.  3. Gait disturbance.   PROCEDURES PERFORMED: 1. Abdominal aortogram.  2. Bilateral lower extremity distal runoff.  3. Percutaneous transluminal angioplasty of the right external iliac artery for in-stent restenosis using a 6 mm Lutonix balloon.  4. Percutaneous transluminal angioplasty and stent placement, left external iliac artery, with a 7 x 6 LifeStent post dilated with a Lutonix balloon to 6 mm.   SURGEON: Katha Cabal, MD   SEDATION:  Versed plus fentanyl.   ACCESS: A 6 French sheath,  left common femoral artery.   CONTRAST USED: Isovue 105 mL.   FLUOROSCOPY TIME: 6.5 minutes.   INDICATIONS: [name] is a 64 year old gentleman who presented to the office with increasing pain and difficulty with his mobility, noninvasive studies as well as physical examination demonstrated significant deterioration since his last visit, and his vascular status and he is therefore undergoing angiography with the hope for intervention. The risks and benefits are reviewed. All questions answered. The patient agrees to proceed.   DESCRIPTION OF PROCEDURE: The patient is taken to special procedures and placed in the supine position.  After adequate sedation is achieved, both groins are prepped and draped in a sterile fashion. Lidocaine 1%  is infiltrated in the soft tissues overlying the right common femoral artery, and under ultrasound, which is placed in a sterile sleeve. Common  femoral artery is identified. It is echolucent and pulsatile indicating patency. Image is recorded for the permanent record and under real time visualization, lidocaine is infiltrated and subsequently micropuncture needle is inserted, microwire followed by microsheath, J-wire followed by a 5 French sheath and 5 French pigtail catheter.   The pigtail catheter is positioned at T12 and AP projection of the aorta is obtained. Pigtail catheter is positioned to above the bifurcation and bilateral oblique views of the pelvis are obtained.   The detector is then repositioned AP and distal runoff is obtained. Imaging on the right is  inadequate secondary to poor flow and therefore, using a stiff angled Glidewire and a pigtail catheter, the catheter is negotiated across the bifurcation down into the profunda where distal imaging of the right lower extremity is obtained.  Subsequently, a Magic torque wire is exchanged and a Balkin sheath is advanced up and over the bifurcation; 3000 units of heparin is given.   A 5 x 8 Lutonix balloon is used to treat the external iliac on the right. Inflation is to 12 atmospheres for 3 minutes. Subsequently, a 6 x 6 balloon is used to post dilate the area of greatest stricture. Follow-up imaging now demonstrates a well expanded lumen with good gain and minimal residual stenosis. The Balkin sheath is then repositioned to the bifurcation and  magnified imaging of the common iliac is noted. There is no stenosis identified at this level as was suggested on the AP image.   The Balkin sheath is then exchanged for a 6 French 11 cm sheath and magnified oblique views of the left external iliac artery is obtained. The high grade stenosis is clearly delineated and subsequently a 7  x 60 LifeStent is deployed across this lesion and it is then postdilated with a 6 x 6 Lutonix balloon. Follow-up imaging demonstrates an excellent result with minimal residual stenosis, and therefore a Mynx device was  then deployed successfully after LAO projection of the groin was obtained. There are no immediate complications.   INTERPRETATION: The aorta is diffusely diseased although there are no hemodynamically significant stenoses. On initial imaging, there was a question of common iliac stenosis on magnified imaging with injection from the Balkin sheath. There is no evidence whatsoever of  common iliac hemodynamically significant lesions. In the previously stented right external iliac, there is a greater than 80% narrowing. In the native left external iliac, there is a greater than 80% narrowing. Common femorals demonstrate moderate disease bilaterally in the 50% range. Profunda femoris are patent on both sides. The SFAs are diffusely diseased on both sides with several areas of hemodynamically significant lesions. Popliteals are patent and there is diffuse disease throughout the tibials with the posterior tibial being the dominant runoff on the left and the anterior tibial being the dominant runoff on the right.  Peroneals patent, but diffusely diseased.   Following angioplasty to 6 mm on the left, there is excellent result with minimal residual stenosis. Following angioplasty and stent placement on the left, there is minimal residual stenosis.   SUMMARY: Successful treatment of hemodynamically significant iliac lesions bilaterally.    ____________________________ Katha Cabal, MD ggs:tr D: 10/01/2014 13:26:14 ET T: 10/01/2014 14:19:25 ET JOB#: [id number]  cc: Katha Cabal, MD, <Dictator> Katha Cabal MD ELECTRONICALLY SIGNED 10/04/2014 13:05

## 2015-03-20 ENCOUNTER — Other Ambulatory Visit: Payer: Self-pay | Admitting: Otolaryngology

## 2015-03-20 DIAGNOSIS — J3801 Paralysis of vocal cords and larynx, unilateral: Secondary | ICD-10-CM

## 2015-03-27 ENCOUNTER — Ambulatory Visit
Admission: RE | Admit: 2015-03-27 | Discharge: 2015-03-27 | Disposition: A | Discharge status: Home / Self Care | Payer: Medicare Other | Source: Ambulatory Visit | Attending: Otolaryngology | Admitting: Otolaryngology

## 2015-03-27 DIAGNOSIS — J3801 Paralysis of vocal cords and larynx, unilateral: Secondary | ICD-10-CM | POA: Diagnosis present

## 2015-03-27 DIAGNOSIS — Z8673 Personal history of transient ischemic attack (TIA), and cerebral infarction without residual deficits: Secondary | ICD-10-CM | POA: Insufficient documentation

## 2015-03-27 DIAGNOSIS — R918 Other nonspecific abnormal finding of lung field: Secondary | ICD-10-CM | POA: Diagnosis not present

## 2015-03-27 LAB — POCT I-STAT CREATININE: CREATININE: 0.8 mg/dL (ref 0.61–1.24)

## 2015-03-27 MED ORDER — IOHEXOL 300 MG/ML  SOLN
75.0000 mL | Freq: Once | INTRAMUSCULAR | Status: AC | PRN
  Administered 2015-03-27: 75 mL via INTRAVENOUS

## 2015-04-08 ENCOUNTER — Inpatient Hospital Stay: Payer: Medicare Other | Attending: Oncology | Admitting: Oncology

## 2015-04-08 ENCOUNTER — Inpatient Hospital Stay: Payer: Medicare Other

## 2015-04-08 ENCOUNTER — Encounter: Payer: Self-pay | Admitting: *Deleted

## 2015-04-08 VITALS — BP 113/70 | HR 68 | Temp 97.2°F | Resp 18

## 2015-04-08 DIAGNOSIS — C7801 Secondary malignant neoplasm of right lung: Secondary | ICD-10-CM | POA: Insufficient documentation

## 2015-04-08 DIAGNOSIS — R490 Dysphonia: Secondary | ICD-10-CM | POA: Insufficient documentation

## 2015-04-08 DIAGNOSIS — F17210 Nicotine dependence, cigarettes, uncomplicated: Secondary | ICD-10-CM

## 2015-04-08 DIAGNOSIS — I10 Essential (primary) hypertension: Secondary | ICD-10-CM | POA: Insufficient documentation

## 2015-04-08 DIAGNOSIS — Z8673 Personal history of transient ischemic attack (TIA), and cerebral infarction without residual deficits: Secondary | ICD-10-CM | POA: Insufficient documentation

## 2015-04-08 DIAGNOSIS — R918 Other nonspecific abnormal finding of lung field: Secondary | ICD-10-CM

## 2015-04-08 DIAGNOSIS — Z7984 Long term (current) use of oral hypoglycemic drugs: Secondary | ICD-10-CM | POA: Insufficient documentation

## 2015-04-08 DIAGNOSIS — I739 Peripheral vascular disease, unspecified: Secondary | ICD-10-CM | POA: Diagnosis not present

## 2015-04-08 DIAGNOSIS — I2510 Atherosclerotic heart disease of native coronary artery without angina pectoris: Secondary | ICD-10-CM | POA: Insufficient documentation

## 2015-04-08 DIAGNOSIS — F419 Anxiety disorder, unspecified: Secondary | ICD-10-CM | POA: Diagnosis not present

## 2015-04-08 DIAGNOSIS — Z7982 Long term (current) use of aspirin: Secondary | ICD-10-CM | POA: Insufficient documentation

## 2015-04-08 DIAGNOSIS — C7802 Secondary malignant neoplasm of left lung: Secondary | ICD-10-CM | POA: Insufficient documentation

## 2015-04-08 DIAGNOSIS — E119 Type 2 diabetes mellitus without complications: Secondary | ICD-10-CM | POA: Insufficient documentation

## 2015-04-08 DIAGNOSIS — Z951 Presence of aortocoronary bypass graft: Secondary | ICD-10-CM | POA: Insufficient documentation

## 2015-04-08 DIAGNOSIS — F1010 Alcohol abuse, uncomplicated: Secondary | ICD-10-CM | POA: Insufficient documentation

## 2015-04-08 DIAGNOSIS — C3490 Malignant neoplasm of unspecified part of unspecified bronchus or lung: Secondary | ICD-10-CM | POA: Diagnosis not present

## 2015-04-08 DIAGNOSIS — M25511 Pain in right shoulder: Secondary | ICD-10-CM

## 2015-04-08 LAB — PROTIME-INR
INR: 0.99
PROTHROMBIN TIME: 13.3 s (ref 11.4–15.0)

## 2015-04-08 LAB — COMPREHENSIVE METABOLIC PANEL
ALT: 12 U/L — ABNORMAL LOW (ref 17–63)
AST: 20 U/L (ref 15–41)
Albumin: 4 g/dL (ref 3.5–5.0)
Alkaline Phosphatase: 87 U/L (ref 38–126)
Anion gap: 8 (ref 5–15)
BUN: 18 mg/dL (ref 6–20)
CO2: 28 mmol/L (ref 22–32)
Calcium: 8.5 mg/dL — ABNORMAL LOW (ref 8.9–10.3)
Chloride: 92 mmol/L — ABNORMAL LOW (ref 101–111)
Creatinine, Ser: 0.98 mg/dL (ref 0.61–1.24)
GFR calc Af Amer: >60 mL/min (ref >60)
GFR calc non Af Amer: >60 mL/min (ref >60)
Glucose, Bld: 174 mg/dL — ABNORMAL HIGH (ref 65–99)
Potassium: 3.8 mmol/L (ref 3.5–5.1)
Sodium: 128 mmol/L — ABNORMAL LOW (ref 135–145)
Total Bilirubin: 0.7 mg/dL (ref 0.3–1.2)
Total Protein: 7.3 g/dL (ref 6.5–8.1)

## 2015-04-08 LAB — CBC WITH DIFFERENTIAL/PLATELET
Basophils Absolute: 0 10*3/uL (ref 0–0.1)
Basophils Relative: 0 %
Eosinophils Absolute: 0.1 10*3/uL (ref 0–0.7)
Eosinophils Relative: 1 %
HCT: 39.8 % — ABNORMAL LOW (ref 40.0–52.0)
Hemoglobin: 13.4 g/dL (ref 13.0–18.0)
Lymphocytes Relative: 13 %
Lymphs Abs: 1.1 10*3/uL (ref 1.0–3.6)
MCH: 28.8 pg (ref 26.0–34.0)
MCHC: 33.6 g/dL (ref 32.0–36.0)
MCV: 85.6 fL (ref 80.0–100.0)
Monocytes Absolute: 0.7 10*3/uL (ref 0.2–1.0)
Monocytes Relative: 9 %
Neutro Abs: 6.4 10*3/uL (ref 1.4–6.5)
Neutrophils Relative %: 77 %
Platelets: 182 10*3/uL (ref 150–440)
RBC: 4.65 MIL/uL (ref 4.40–5.90)
RDW: 17.2 % — ABNORMAL HIGH (ref 11.5–14.5)
WBC: 8.4 10*3/uL (ref 3.8–10.6)

## 2015-04-08 LAB — APTT: aPTT: 29 seconds (ref 24–36)

## 2015-04-08 NOTE — Progress Notes (Signed)
Patient is a resident at [facility].  He was having hoarseness of voice which lead to him receiving a CT that showed lung mass.  Has right shoulder pain that radiates to his back that is 10/10 on pain scale.

## 2015-04-08 NOTE — Progress Notes (Signed)
  Oncology Nurse Navigator Documentation    Navigator Encounter Type: Initial MedOnc (04/08/15 1200) Patient Visit Type: Medonc (04/08/15 1200) Treatment Phase: Abnormal Scans (04/08/15 1200)                  Time Spent with Patient: 45 (04/08/15 1200)   Met with patient at medical oncology appointment. Introduced Programmer, multimedia and reviewed plan of care. Will follow. Discussed with daughter and sister plan of care to include dedicated Ct scan of chest, likely endobronchial biopsy, and then follow up with medical oncology after that.

## 2015-04-11 ENCOUNTER — Ambulatory Visit
Admission: RE | Admit: 2015-04-11 | Discharge: 2015-04-11 | Disposition: A | Discharge status: Home / Self Care | Payer: Medicare Other | Source: Ambulatory Visit | Attending: Oncology | Admitting: Oncology

## 2015-04-11 ENCOUNTER — Telehealth: Payer: Self-pay | Admitting: *Deleted

## 2015-04-11 DIAGNOSIS — K5730 Diverticulosis of large intestine without perforation or abscess without bleeding: Secondary | ICD-10-CM | POA: Diagnosis not present

## 2015-04-11 DIAGNOSIS — R590 Localized enlarged lymph nodes: Secondary | ICD-10-CM | POA: Diagnosis not present

## 2015-04-11 DIAGNOSIS — I700 Atherosclerosis of aorta: Secondary | ICD-10-CM | POA: Diagnosis not present

## 2015-04-11 DIAGNOSIS — K5710 Diverticulosis of small intestine without perforation or abscess without bleeding: Secondary | ICD-10-CM | POA: Insufficient documentation

## 2015-04-11 DIAGNOSIS — R918 Other nonspecific abnormal finding of lung field: Secondary | ICD-10-CM

## 2015-04-11 DIAGNOSIS — M4854XA Collapsed vertebra, not elsewhere classified, thoracic region, initial encounter for fracture: Secondary | ICD-10-CM | POA: Insufficient documentation

## 2015-04-11 DIAGNOSIS — K8020 Calculus of gallbladder without cholecystitis without obstruction: Secondary | ICD-10-CM | POA: Diagnosis not present

## 2015-04-11 MED ORDER — IOHEXOL 300 MG/ML  SOLN
100.0000 mL | Freq: Once | INTRAMUSCULAR | Status: AC | PRN
  Administered 2015-04-11: 100 mL via INTRAVENOUS

## 2015-04-11 NOTE — Telephone Encounter (Signed)
Notified sister, Peter Congo, of CT scan completion and no change in plan for endobronchial biopsy attempt. Sister reports that plavix is prescribed by Dr. Delana Meyer. Message sent to Dr. Delana Meyer requesting permission to hold plavix for biopsy.

## 2015-04-14 ENCOUNTER — Telehealth: Payer: Self-pay | Admitting: *Deleted

## 2015-04-14 NOTE — Telephone Encounter (Signed)
Contacted Dr. Nino Parsley office and obtained approval to hold ASA and Plavix for lung biopsy per April.

## 2015-04-16 ENCOUNTER — Telehealth: Payer: Self-pay | Admitting: *Deleted

## 2015-04-16 NOTE — Progress Notes (Signed)
[place]  [phone] [phone]  ID: Mare Loan OB: [birth year]  MR#: [id number]  PZW#:[id number]  Patient Care Team: Lynnell Jude, MD as PCP - General (Family Medicine)  CHIEF COMPLAINT:  Chief Complaint  Patient presents with  . Lung Mass    INTERVAL HISTORY: Patient is a 64 year old male who was complaining of increased hoarseness of voice. A referral to ENT and subsequent CT scan of his next revealed a suspicious lung mass. Patient also has pain that radiates to his back. He otherwise feels well. He has no neurologic complaints. He denies any recent fevers or illnesses. He has a good appetite and denies weight loss. He denies any shortness of breath, cough, or hemoptysis. He has no nausea, vomiting, constipation, or diarrhea. He has no urinary complaints. Patient otherwise feels well and offers no further specific complaints.  REVIEW OF SYSTEMS:   Review of Systems  Constitutional: Negative for fever, weight loss and malaise/fatigue.  HENT:       Hoarseness of voice.  Respiratory: Negative.  Negative for cough, hemoptysis and shortness of breath.   Cardiovascular: Positive for chest pain.  Musculoskeletal: Positive for back pain.  Neurological: Negative.  Negative for weakness.    As per HPI. Otherwise, a complete review of systems is negatve.  PAST MEDICAL HISTORY: Past Medical History  Diagnosis Date  . Coronary artery disease     S/p CABG  . Hypertension   . Diabetes mellitus   . Stroke [facility])     CT head with old left occipital, old biparietal infacts. Has right weakness  . Anxiety   . Tobacco abuse   . Alcohol abuse   . PVD (peripheral vascular disease) ([place])     CABG and s/p left CEA     PAST SURGICAL HISTORY: Past Surgical History  Procedure Laterality Date  . Cardiac surgery      CABG  . Carotid endarterectomy      left side  . Heart stents      FAMILY HISTORY No family history on  file.     ADVANCED DIRECTIVES:    HEALTH MAINTENANCE: Social History  Substance Use Topics  . Smoking status: Current Every Day Smoker -- 1.00 packs/day for 50 years    Types: Cigarettes  . Smokeless tobacco: Never Used  . Alcohol Use: Yes     Comment: daily     Colonoscopy:  PAP:  Bone density:  Lipid panel:  Allergies  Allergen Reactions  . Penicillins Rash    Current Outpatient Prescriptions  Medication Sig Dispense Refill  . acetaminophen (TYLENOL) 500 MG tablet Take 500 mg by mouth every 6 (six) hours as needed. Pain    . ALPRAZolam (XANAX) 0.25 MG tablet     . aspirin EC 81 MG tablet Take by mouth.    Marland Kitchen atorvastatin (LIPITOR) 40 MG tablet     . busPIRone (BUSPAR) 15 MG tablet Take by mouth.    . cetirizine (ZYRTEC) 10 MG tablet Take 10 mg by mouth daily.    . clopidogrel (PLAVIX) 75 MG tablet Take by mouth.    . finasteride (PROSCAR) 5 MG tablet Take by mouth.    . folic acid (FOLVITE) 423 MCG tablet Take by mouth.    . furosemide (LASIX) 20 MG tablet     . HYDROcodone-acetaminophen (NORCO/VICODIN) 5-325 MG tablet Take by mouth.    . lamoTRIgine (LAMICTAL) 200 MG tablet Take 1 tablet (200 mg total) by mouth 2 (two) times daily.  60 tablet 11  . Liniments (SALONPAS PAIN RELIEF PATCH) PADS Apply 1 each topically daily.    Marland Kitchen lisinopril (PRINIVIL,ZESTRIL) 10 MG tablet Take by mouth.    . meclizine (ANTIVERT) 25 MG tablet     . metFORMIN (GLUCOPHAGE-XR) 500 MG 24 hr tablet     . metoprolol tartrate (LOPRESSOR) 25 MG tablet     . ondansetron (ZOFRAN) 8 MG tablet Take by mouth.    . pantoprazole (PROTONIX) 40 MG tablet     . PARoxetine (PAXIL) 40 MG tablet Take by mouth.    . polyethylene glycol powder (GLYCOLAX/MIRALAX) powder Take by mouth.    . tamsulosin (FLOMAX) 0.4 MG CAPS capsule     . tiotropium (SPIRIVA) 18 MCG inhalation capsule Place into inhaler and inhale.    Marland Kitchen tiZANidine (ZANAFLEX) 4 MG tablet Take by mouth.    . traZODone (DESYREL) 100 MG tablet     .  Vitamin D, Ergocalciferol, (DRISDOL) 50000 UNITS CAPS capsule Take by mouth.     No current facility-administered medications for this visit.    OBJECTIVE: Filed Vitals:   04/08/15 1243  BP: 113/70  Pulse: 68  Temp: 97.2 F (36.2 C)  Resp: 18     There is no weight on file to calculate BMI.    ECOG FS:1 - Symptomatic but completely ambulatory  General: Well-developed, well-nourished, no acute distress. Eyes: Pink conjunctiva, anicteric sclera. HEENT: Normocephalic, moist mucous membranes, clear oropharnyx. Lungs: Clear to auscultation bilaterally. Heart: Regular rate and rhythm. No rubs, murmurs, or gallops. Abdomen: Soft, nontender, nondistended. No organomegaly noted, normoactive bowel sounds. Musculoskeletal: No edema, cyanosis, or clubbing. Neuro: Alert, answering all questions appropriately. Cranial nerves grossly intact. Skin: No rashes or petechiae noted. Psych: Normal affect. Lymphatics: No cervical, calvicular, axillary or inguinal LAD.   LAB RESULTS:  Lab Results  Component Value Date   NA 128* 04/08/2015   K 3.8 04/08/2015   CL 92* 04/08/2015   CO2 28 04/08/2015   GLUCOSE 174* 04/08/2015   BUN 18 04/08/2015   CREATININE 0.98 04/08/2015   CALCIUM 8.5* 04/08/2015   PROT 7.3 04/08/2015   ALBUMIN 4.0 04/08/2015   AST 20 04/08/2015   ALT 12* 04/08/2015   ALKPHOS 87 04/08/2015   BILITOT 0.7 04/08/2015   GFRNONAA >60 04/08/2015   GFRAA >60 04/08/2015    Lab Results  Component Value Date   WBC 8.4 04/08/2015   NEUTROABS 6.4 04/08/2015   HGB 13.4 04/08/2015   HCT 39.8* 04/08/2015   MCV 85.6 04/08/2015   PLT 182 04/08/2015     STUDIES: Ct Soft Tissue Neck W Contrast  03/27/2015  CLINICAL DATA:  Left vocal cord paralysis. EXAM: CT NECK WITH CONTRAST TECHNIQUE: Multidetector CT imaging of the neck was performed using the standard protocol following the bolus administration of intravenous contrast. CONTRAST:  82m OMNIPAQUE IOHEXOL 300 MG/ML  SOLN  COMPARISON:  None. FINDINGS: Pharynx and larynx: Oral cavity and pharynx are symmetric and normal. Tonsils are symmetric. Epiglottis is normal. Epiglottis appears normal. Piriform sinus is symmetric. No mass in the larynx. Salivary glands: The parotid and submandibular glands normal bilaterally. Thyroid: Negative Lymph nodes: Subcutaneous cystic nodule right face anterior to the parotid gland measures 16 mm. This is probably a sebaceous cyst and not an enlarged lymph node. Otherwise no adenopathy in the neck. See comments on mediastinum. Vascular: Carotid stent in the left internal carotid artery. The stent appears patent. There is moderate narrowing of the internal carotid artery just above the stent.  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 Both vertebral arteries are patent. Jugular vein patent bilaterally. Limited intracranial: Negative Visualized orbits: Negative Mastoids and visualized paranasal sinuses:  negative Skeleton: Negative for cervical spine fracture. No evidence of bony metastatic disease. Upper chest: Spiculated mass left upper lobe 18 x 24 mm. Just below this is a second mass lesion measuring 12 mm. 2 small nodules in the anterior left apex 8 mm pleural best nodule right upper lobe. Right upper lobe irregular mass measures 10 mm. Right peritracheal lymph node is necrotic and pathologic measuring 15.5 mm. 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 CT chest with contrast recommended for further evaluation. IMPRESSION: Left upper lobe masses and right upper lobe mass. 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 Right peritracheal node in the AP window node are enlarging consistent with metastatic disease. 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 CT chest with contrast recommended for further evaluation. No significant neck adenopathy. Electronically Signed   By: Franchot Gallo M.D.   On: 03/27/2015 13:01   Ct Chest W Contrast  04/11/2015  CLINICAL DATA:  Initial encounter for hoarseness and right shoulder pain for 3 months. EXAM: CT CHEST, ABDOMEN, AND PELVIS WITH CONTRAST TECHNIQUE: Multidetector CT imaging of the chest, abdomen and pelvis was performed following the standard protocol during bolus administration of intravenous contrast. CONTRAST:  181m OMNIPAQUE IOHEXOL 300 MG/ML  SOLN COMPARISON:  Abdomen and pelvis CT from 06/02/2014. FINDINGS: CT CHEST FINDINGS Mediastinum/Lymph Nodes: There is no axillary lymphadenopathy. 17 mm short axis right paratracheal lymph node is associated with 2.8 x 3.5 cm irregular soft tissue mass in the AP window. 3.4 x 2.5 cm abnormal soft tissue mass is identified in the upper left hilum. 1.7 cm short axis precarinal lymph node evident. The heart size is normal. No pericardial effusion. Patient is status post CABG. Mild circumferential wall thickening is seen in the mid and distal esophagus. Lungs/Pleura: Multiple bilateral pulmonary nodules are evident. 2.3 cm irregular nodule is seen in the left apex (image 9 series 5). Multiple other bilateral pulmonary nodules range in size from several mm up to a right upper lobe 12 mm lesion seen on image 12. Musculoskeletal: Bone windows reveal no worrisome lytic or sclerotic osseous lesions. T12 compression fracture is stable in the interval. CT ABDOMEN PELVIS FINDINGS Hepatobiliary: No focal abnormality within the liver parenchyma. Calcified stones are seen in the gallbladder measuring up to 13 mm diameter. No intrahepatic or extrahepatic biliary dilation. Pancreas: No focal mass lesion. No dilatation of the main duct. No intraparenchymal cyst. No peripancreatic edema. Spleen: No splenomegaly. No focal mass lesion. Adrenals/Urinary Tract: No adrenal  nodule or mass. Kidneys are unremarkable. No evidence for hydroureter. The urinary bladder appears normal for the degree of distention. Stomach/Bowel: Stomach is nondistended. No gastric wall thickening. No evidence of outlet obstruction. Large duodenum diverticulum noted. No small bowel wall thickening. No small bowel dilatation. The terminal ileum is normal. The appendix is normal. Diverticular changes are noted in the left colon without evidence of diverticulitis. Vascular/Lymphatic: There is abdominal aortic atherosclerosis without aneurysm. There is no gastrohepatic or hepatoduodenal ligament lymphadenopathy. No intraperitoneal or retroperitoneal lymphadenopy. Reproductive: The prostate gland and seminal vesicles have normal imaging  features. Other: No intraperitoneal free fluid. Musculoskeletal: Bone windows reveal no worrisome lytic or sclerotic osseous lesions. IMPRESSION: 1. Mediastinal and hilar lymphadenopathy associated with bilateral numerous pulmonary nodules, imaging features consistent with metastatic disease. Lung Primary would be a consideration, with the largest lung nodule measuring 2.3 cm in the left upper lobe. Mild circumferential wall thickening is noted in the distal esophagus, stable in the interval, but esophageal neoplasm would also be a consideration. PET-CT may prove helpful to further evaluate. 2. No evidence for primary malignancy or metastatic disease in the abdomen or pelvis. 3. Cholelithiasis. 4. Atherosclerosis of the abdominal aorta. 5. Large duodenum diverticulum with left-sided colonic diverticulosis. 6. Stable T12 compression fracture. Electronically Signed   By: Misty Stanley M.D.   On: 04/11/2015 12:26   Ct Abdomen Pelvis W Contrast  04/11/2015  CLINICAL DATA:  Initial encounter for hoarseness and right shoulder pain for 3 months. EXAM: CT CHEST, ABDOMEN, AND PELVIS WITH CONTRAST TECHNIQUE: Multidetector CT imaging of the chest, abdomen and pelvis was performed following  the standard protocol during bolus administration of intravenous contrast. CONTRAST:  114m OMNIPAQUE IOHEXOL 300 MG/ML  SOLN COMPARISON:  Abdomen and pelvis CT from 06/02/2014. FINDINGS: CT CHEST FINDINGS Mediastinum/Lymph Nodes: There is no axillary lymphadenopathy. 17 mm short axis right paratracheal lymph node is associated with 2.8 x 3.5 cm irregular soft tissue mass in the AP window. 3.4 x 2.5 cm abnormal soft tissue mass is identified in the upper left hilum. 1.7 cm short axis precarinal lymph node evident. The heart size is normal. No pericardial effusion. Patient is status post CABG. Mild circumferential wall thickening is seen in the mid and distal esophagus. Lungs/Pleura: Multiple bilateral pulmonary nodules are evident. 2.3 cm irregular nodule is seen in the left apex (image 9 series 5). Multiple other bilateral pulmonary nodules range in size from several mm up to a right upper lobe 12 mm lesion seen on image 12. Musculoskeletal: Bone windows reveal no worrisome lytic or sclerotic osseous lesions. T12 compression fracture is stable in the interval. CT ABDOMEN PELVIS FINDINGS Hepatobiliary: No focal abnormality within the liver parenchyma. Calcified stones are seen in the gallbladder measuring up to 13 mm diameter. No intrahepatic or extrahepatic biliary dilation. Pancreas: No focal mass lesion. No dilatation of the main duct. No intraparenchymal cyst. No peripancreatic edema. Spleen: No splenomegaly. No focal mass lesion. Adrenals/Urinary Tract: No adrenal nodule or mass. Kidneys are unremarkable. No evidence for hydroureter. The urinary bladder appears normal for the degree of distention. Stomach/Bowel: Stomach is nondistended. No gastric wall thickening. No evidence of outlet obstruction. Large duodenum diverticulum noted. No small bowel wall thickening. No small bowel dilatation. The terminal ileum is normal. The appendix is normal. Diverticular changes are noted in the left colon without evidence of  diverticulitis. Vascular/Lymphatic: There is abdominal aortic atherosclerosis without aneurysm. There is no gastrohepatic or hepatoduodenal ligament lymphadenopathy. No intraperitoneal or retroperitoneal lymphadenopy. Reproductive: The prostate gland and seminal vesicles have normal imaging features. Other: No intraperitoneal free fluid. Musculoskeletal: Bone windows reveal no worrisome lytic or sclerotic osseous lesions. IMPRESSION: 1. Mediastinal and hilar lymphadenopathy associated with bilateral numerous pulmonary nodules, imaging features consistent with metastatic disease. Lung Primary would be a consideration, with the largest lung nodule measuring 2.3 cm in the left upper lobe. Mild circumferential wall thickening is noted in the distal esophagus, stable in the interval, but esophageal neoplasm would also be a consideration. PET-CT may prove helpful to further evaluate. 2. No evidence for primary malignancy or metastatic disease  in the abdomen or pelvis. 3. Cholelithiasis. 4. Atherosclerosis of the abdominal aorta. 5. Large duodenum diverticulum with left-sided colonic diverticulosis. 6. Stable T12 compression fracture. Electronically Signed   By: Misty Stanley M.D.   On: 04/11/2015 12:26    ASSESSMENT: Bilateral pulmonary nodules highly suspicious for metastatic disease.  PLAN:    1. Pulmonary nodules: CT scan results reviewed independently and reported as above with bilateral pulmonary nodules highly suspicious for underlying metastatic disease. Lung primary is a significant possibility. Patient required cardiac approval to discontinue Plavix and aspirin prior to have a biopsy which is now scheduled for April 21, 2015. Patient will follow-up approximately one week later for further evaluation and discussion of his pathology results and treatment planning. 2. Hoarseness of voice: Likely secondary to underlying malignancy as above.  Approximate 45 minutes was spent in discussion and  consultation.  Patient expressed understanding and was in agreement with this plan. He also understands that He can call clinic at any time with any questions, concerns, or complaints.    Lloyd Huger, MD   04/16/2015 3:07 PM

## 2015-04-16 NOTE — Telephone Encounter (Signed)
  Oncology Nurse Navigator Documentation    Navigator Encounter Type: Telephone (04/16/15 1400)         Interventions: Coordination of Care (04/16/15 1400)                Notified sister, Peter Congo of pre-op appointment scheduled for *04/18/15 9:15am in [place], 2nd floor, to bring medications. Also notified of procedure scheduled for 04/21/15, [place] arrival. Reminded to be NPO after midnight, continue to avoid anticoagulant medications, and have a driver and support person to be with patient at home after the procedure. Sister verbalizes understanding and reads back appointments.

## 2015-04-18 ENCOUNTER — Encounter
Admission: RE | Admit: 2015-04-18 | Discharge: 2015-04-18 | Disposition: A | Discharge status: Home / Self Care | Payer: Medicare Other | Source: Ambulatory Visit | Attending: Internal Medicine | Admitting: Internal Medicine

## 2015-04-18 DIAGNOSIS — I2510 Atherosclerotic heart disease of native coronary artery without angina pectoris: Secondary | ICD-10-CM | POA: Diagnosis not present

## 2015-04-18 DIAGNOSIS — J449 Chronic obstructive pulmonary disease, unspecified: Secondary | ICD-10-CM | POA: Diagnosis not present

## 2015-04-18 DIAGNOSIS — R918 Other nonspecific abnormal finding of lung field: Secondary | ICD-10-CM | POA: Diagnosis present

## 2015-04-18 DIAGNOSIS — K219 Gastro-esophageal reflux disease without esophagitis: Secondary | ICD-10-CM | POA: Diagnosis not present

## 2015-04-18 DIAGNOSIS — F329 Major depressive disorder, single episode, unspecified: Secondary | ICD-10-CM | POA: Diagnosis not present

## 2015-04-18 DIAGNOSIS — I69951 Hemiplegia and hemiparesis following unspecified cerebrovascular disease affecting right dominant side: Secondary | ICD-10-CM | POA: Diagnosis not present

## 2015-04-18 DIAGNOSIS — Z955 Presence of coronary angioplasty implant and graft: Secondary | ICD-10-CM | POA: Diagnosis not present

## 2015-04-18 DIAGNOSIS — F17210 Nicotine dependence, cigarettes, uncomplicated: Secondary | ICD-10-CM | POA: Diagnosis not present

## 2015-04-18 DIAGNOSIS — E119 Type 2 diabetes mellitus without complications: Secondary | ICD-10-CM | POA: Diagnosis not present

## 2015-04-18 DIAGNOSIS — Z880 Allergy status to penicillin: Secondary | ICD-10-CM | POA: Diagnosis not present

## 2015-04-18 DIAGNOSIS — I739 Peripheral vascular disease, unspecified: Secondary | ICD-10-CM | POA: Diagnosis not present

## 2015-04-18 DIAGNOSIS — Z993 Dependence on wheelchair: Secondary | ICD-10-CM | POA: Diagnosis not present

## 2015-04-18 DIAGNOSIS — Z951 Presence of aortocoronary bypass graft: Secondary | ICD-10-CM | POA: Diagnosis not present

## 2015-04-18 DIAGNOSIS — I252 Old myocardial infarction: Secondary | ICD-10-CM | POA: Diagnosis not present

## 2015-04-18 DIAGNOSIS — C3412 Malignant neoplasm of upper lobe, left bronchus or lung: Secondary | ICD-10-CM | POA: Diagnosis not present

## 2015-04-18 DIAGNOSIS — I10 Essential (primary) hypertension: Secondary | ICD-10-CM | POA: Diagnosis not present

## 2015-04-18 DIAGNOSIS — F419 Anxiety disorder, unspecified: Secondary | ICD-10-CM | POA: Diagnosis not present

## 2015-04-18 DIAGNOSIS — G709 Myoneural disorder, unspecified: Secondary | ICD-10-CM | POA: Diagnosis not present

## 2015-04-18 DIAGNOSIS — F1010 Alcohol abuse, uncomplicated: Secondary | ICD-10-CM | POA: Diagnosis not present

## 2015-04-18 DIAGNOSIS — Z7902 Long term (current) use of antithrombotics/antiplatelets: Secondary | ICD-10-CM | POA: Diagnosis not present

## 2015-04-18 HISTORY — DX: Hyperlipidemia, unspecified: E78.5

## 2015-04-18 HISTORY — DX: Depression, unspecified: F32.A

## 2015-04-18 HISTORY — DX: Dizziness and giddiness: R42

## 2015-04-18 HISTORY — DX: Other seasonal allergic rhinitis: J30.2

## 2015-04-18 HISTORY — DX: Other muscle spasm: M62.838

## 2015-04-18 HISTORY — DX: Acute myocardial infarction, unspecified: I21.9

## 2015-04-18 HISTORY — DX: Cough: R05

## 2015-04-18 HISTORY — DX: Cough, unspecified: R05.9

## 2015-04-18 HISTORY — DX: Gastro-esophageal reflux disease without esophagitis: K21.9

## 2015-04-18 HISTORY — DX: Chronic obstructive pulmonary disease, unspecified: J44.9

## 2015-04-18 HISTORY — DX: Localized edema: R60.0

## 2015-04-18 HISTORY — DX: Cerebral infarction, unspecified: I63.9

## 2015-04-18 HISTORY — DX: Major depressive disorder, single episode, unspecified: F32.9

## 2015-04-18 NOTE — Patient Instructions (Addendum)
  Your procedure is scheduled on: 04/21/15 Mon  At 7:00 AM Report to Day Surgery.2nd floor [facility] To find out your arrival time please call [phone] between 1PM - 3PM on 04/18/15 Fri  Remember: Instructions that are not followed completely may result in serious medical risk, up to and including death, or upon the discretion of your surgeon and anesthesiologist your surgery may need to be rescheduled.    _x___ 1. Do not eat food or drink liquids after midnight. No gum chewing or hard candies.     ____ 2. No Alcohol for 24 hours before or after surgery.   ____ 3. Bring all medications with you on the day of surgery if instructed.    _x___ 4. Notify your doctor if there is any change in your medical condition     (cold, fever, infections).     Do not wear jewelry, make-up, hairpins, clips or nail polish.  Do not wear lotions, powders, or perfumes. You may wear deodorant.  Do not shave 48 hours prior to surgery. Men may shave face and neck.  Do not bring valuables to the hospital.    [facility] is not responsible for any belongings or valuables.               Contacts, dentures or bridgework may not be worn into surgery.  Leave your suitcase in the car. After surgery it may be brought to your room.  For patients admitted to the hospital, discharge time is determined by your                treatment team.   Patients discharged the day of surgery will not be allowed to drive home.   Please read over the following fact sheets that you were given:      __x__ Take these medicines the morning of surgery with A SIP OF WATER:    1. ALPRAZolam (XANAX) 0.25 MG tablet  2. lisinopril (PRINIVIL,ZESTRIL) 10 MG tablet  3. metoprolol tartrate (LOPRESSOR) 25 MG tablet  4.pantoprazole (PROTONIX) 40 MG tablet  5.PARoxetine (PAXIL) 40 MG tablet  6.busPIRone (BUSPAR) 15 MG tablet  ____ Fleet Enema (as directed)   ____ Use CHG Soap as directed  __x__ Use inhalers on the day of surgery  tiotropium (SPIRIVA) 18 MCG inhalation capsule  _x___ Stop metformin 2 days prior to surgery    ____ Take 1/2 of usual insulin dose the night before surgery and none on the morning of surgery.   __x__ Stop Coumadin/Plavix/aspirin on Aspirin stopped last Thurs, Plavix stopped on Mon.  _x___ Stop Anti-inflammatories on May take Tylenol as needed   ____ Stop supplements until after surgery.    ____ Bring C-Pap to the hospital.

## 2015-04-21 ENCOUNTER — Ambulatory Visit: Payer: Medicare Other | Admitting: Anesthesiology

## 2015-04-21 ENCOUNTER — Encounter: Payer: Self-pay | Admitting: Anesthesiology

## 2015-04-21 ENCOUNTER — Ambulatory Visit
Admission: RE | Admit: 2015-04-21 | Discharge: 2015-04-21 | Disposition: A | Discharge status: Home / Self Care | Payer: Medicare Other | Source: Ambulatory Visit | Attending: Internal Medicine | Admitting: Internal Medicine

## 2015-04-21 ENCOUNTER — Encounter
Admission: RE | Disposition: A | Discharge status: Home / Self Care | Payer: Self-pay | Source: Ambulatory Visit | Attending: Internal Medicine

## 2015-04-21 DIAGNOSIS — C3412 Malignant neoplasm of upper lobe, left bronchus or lung: Secondary | ICD-10-CM | POA: Diagnosis not present

## 2015-04-21 DIAGNOSIS — F17210 Nicotine dependence, cigarettes, uncomplicated: Secondary | ICD-10-CM | POA: Insufficient documentation

## 2015-04-21 DIAGNOSIS — E119 Type 2 diabetes mellitus without complications: Secondary | ICD-10-CM | POA: Insufficient documentation

## 2015-04-21 DIAGNOSIS — I69951 Hemiplegia and hemiparesis following unspecified cerebrovascular disease affecting right dominant side: Secondary | ICD-10-CM | POA: Insufficient documentation

## 2015-04-21 DIAGNOSIS — G709 Myoneural disorder, unspecified: Secondary | ICD-10-CM | POA: Insufficient documentation

## 2015-04-21 DIAGNOSIS — F1010 Alcohol abuse, uncomplicated: Secondary | ICD-10-CM | POA: Insufficient documentation

## 2015-04-21 DIAGNOSIS — F329 Major depressive disorder, single episode, unspecified: Secondary | ICD-10-CM | POA: Insufficient documentation

## 2015-04-21 DIAGNOSIS — I739 Peripheral vascular disease, unspecified: Secondary | ICD-10-CM | POA: Insufficient documentation

## 2015-04-21 DIAGNOSIS — Z880 Allergy status to penicillin: Secondary | ICD-10-CM | POA: Insufficient documentation

## 2015-04-21 DIAGNOSIS — Z951 Presence of aortocoronary bypass graft: Secondary | ICD-10-CM | POA: Insufficient documentation

## 2015-04-21 DIAGNOSIS — Z7902 Long term (current) use of antithrombotics/antiplatelets: Secondary | ICD-10-CM | POA: Insufficient documentation

## 2015-04-21 DIAGNOSIS — K219 Gastro-esophageal reflux disease without esophagitis: Secondary | ICD-10-CM | POA: Insufficient documentation

## 2015-04-21 DIAGNOSIS — I2510 Atherosclerotic heart disease of native coronary artery without angina pectoris: Secondary | ICD-10-CM | POA: Insufficient documentation

## 2015-04-21 DIAGNOSIS — R918 Other nonspecific abnormal finding of lung field: Secondary | ICD-10-CM | POA: Insufficient documentation

## 2015-04-21 DIAGNOSIS — F419 Anxiety disorder, unspecified: Secondary | ICD-10-CM | POA: Insufficient documentation

## 2015-04-21 DIAGNOSIS — I252 Old myocardial infarction: Secondary | ICD-10-CM | POA: Insufficient documentation

## 2015-04-21 DIAGNOSIS — Z993 Dependence on wheelchair: Secondary | ICD-10-CM | POA: Insufficient documentation

## 2015-04-21 DIAGNOSIS — Z955 Presence of coronary angioplasty implant and graft: Secondary | ICD-10-CM | POA: Insufficient documentation

## 2015-04-21 DIAGNOSIS — I10 Essential (primary) hypertension: Secondary | ICD-10-CM | POA: Insufficient documentation

## 2015-04-21 DIAGNOSIS — J449 Chronic obstructive pulmonary disease, unspecified: Secondary | ICD-10-CM | POA: Insufficient documentation

## 2015-04-21 HISTORY — PX: ENDOBRONCHIAL ULTRASOUND: SHX5096

## 2015-04-21 HISTORY — PX: VIDEO BRONCHOSCOPY: SHX5072

## 2015-04-21 LAB — GLUCOSE, CAPILLARY
Glucose-Capillary: 148 mg/dL — ABNORMAL HIGH (ref 65–99)
Glucose-Capillary: 112 mg/dL — ABNORMAL HIGH (ref 65–99)

## 2015-04-21 SURGERY — ENDOBRONCHIAL ULTRASOUND (EBUS)
Anesthesia: General

## 2015-04-21 MED ORDER — FENTANYL CITRATE (PF) 100 MCG/2ML IJ SOLN: 25.0000 ug | INTRAMUSCULAR | Status: DC | PRN

## 2015-04-21 MED ORDER — DEXAMETHASONE SODIUM PHOSPHATE 4 MG/ML IJ SOLN
INTRAMUSCULAR | Status: DC | PRN
  Administered 2015-04-21: 4 mg via INTRAVENOUS

## 2015-04-21 MED ORDER — ONDANSETRON HCL 4 MG/2ML IJ SOLN: 4.0000 mg | Freq: Once | INTRAMUSCULAR | Status: DC | PRN

## 2015-04-21 MED ORDER — IPRATROPIUM-ALBUTEROL 0.5-2.5 (3) MG/3ML IN SOLN
3.0000 mL | Freq: Four times a day (QID) | RESPIRATORY_TRACT | Status: DC
  Administered 2015-04-21: 3 mL via RESPIRATORY_TRACT

## 2015-04-21 MED ORDER — ONDANSETRON HCL 4 MG/2ML IJ SOLN
INTRAMUSCULAR | Status: DC | PRN
  Administered 2015-04-21: 4 mg via INTRAVENOUS

## 2015-04-21 MED ORDER — LACTATED RINGERS IV SOLN: INTRAVENOUS | Status: DC | PRN

## 2015-04-21 MED ORDER — LIDOCAINE HCL (CARDIAC) 20 MG/ML IV SOLN
INTRAVENOUS | Status: DC | PRN
  Administered 2015-04-21: 30 mg via INTRAVENOUS

## 2015-04-21 MED ORDER — IPRATROPIUM-ALBUTEROL 0.5-2.5 (3) MG/3ML IN SOLN
RESPIRATORY_TRACT | Status: AC
  Filled 2015-04-21: qty 3

## 2015-04-21 MED ORDER — GLYCOPYRROLATE 0.2 MG/ML IJ SOLN
INTRAMUSCULAR | Status: DC | PRN
  Administered 2015-04-21: 0.6 mg via INTRAVENOUS

## 2015-04-21 MED ORDER — ROCURONIUM BROMIDE 100 MG/10ML IV SOLN
INTRAVENOUS | Status: DC | PRN
  Administered 2015-04-21: 35 mg via INTRAVENOUS

## 2015-04-21 MED ORDER — MIDAZOLAM HCL 2 MG/2ML IJ SOLN
INTRAMUSCULAR | Status: DC | PRN
  Administered 2015-04-21: 2 mg via INTRAVENOUS

## 2015-04-21 MED ORDER — IPRATROPIUM-ALBUTEROL 0.5-2.5 (3) MG/3ML IN SOLN
RESPIRATORY_TRACT | Status: AC
  Administered 2015-04-21: 3 mL via RESPIRATORY_TRACT
  Filled 2015-04-21: qty 3

## 2015-04-21 MED ORDER — NEOSTIGMINE METHYLSULFATE 10 MG/10ML IV SOLN
INTRAVENOUS | Status: DC | PRN
  Administered 2015-04-21: 4 mg via INTRAVENOUS

## 2015-04-21 MED ORDER — PROPOFOL 10 MG/ML IV BOLUS
INTRAVENOUS | Status: DC | PRN
  Administered 2015-04-21: 150 mg via INTRAVENOUS

## 2015-04-21 MED ORDER — SODIUM CHLORIDE 0.9 % IV SOLN
INTRAVENOUS | Status: DC
  Administered 2015-04-21 (×2): via INTRAVENOUS

## 2015-04-21 MED ORDER — FENTANYL CITRATE (PF) 100 MCG/2ML IJ SOLN
INTRAMUSCULAR | Status: DC | PRN
  Administered 2015-04-21: 100 ug via INTRAVENOUS

## 2015-04-21 MED ORDER — EPHEDRINE SULFATE 50 MG/ML IJ SOLN
INTRAMUSCULAR | Status: DC | PRN
Start: 2015-04-21 — End: 2015-04-21
  Administered 2015-04-21 (×6): 10 mg via INTRAVENOUS

## 2015-04-21 MED ORDER — IPRATROPIUM-ALBUTEROL 0.5-2.5 (3) MG/3ML IN SOLN
3.0000 mL | Freq: Once | RESPIRATORY_TRACT | Status: AC
  Administered 2015-04-21: 3 mL via RESPIRATORY_TRACT

## 2015-04-21 NOTE — Progress Notes (Signed)
Saturations in the 88-92% range. Discussed with Mungal. This is not abnormal for him. Will discharge from PACU.

## 2015-04-21 NOTE — Anesthesia Preprocedure Evaluation (Addendum)
Anesthesia Evaluation  Patient identified by MRN, date of birth, ID band Patient awake    Reviewed: Allergy & Precautions, NPO status , Patient's Chart, lab work & pertinent test results, reviewed documented beta blocker date and time   Airway Mallampati: II  TM Distance: >3 FB     Dental  (+) Edentulous Lower, Upper Dentures   Pulmonary shortness of breath, COPD, Current Smoker,           Cardiovascular hypertension, Pt. on medications and Pt. on home beta blockers + CAD, + Past MI and + Peripheral Vascular Disease       Neuro/Psych PSYCHIATRIC DISORDERS Anxiety Depression  Neuromuscular disease CVA, Residual Symptoms    GI/Hepatic GERD  ,  Endo/Other  diabetes  Renal/GU      Musculoskeletal   Abdominal   Peds  Hematology   Anesthesia Other Findings Denies having an MI. He is wheelchair bound. Wheezing, SVN with duoneb.  Reproductive/Obstetrics                         Anesthesia Physical Anesthesia Plan  ASA: III  Anesthesia Plan: General   Post-op Pain Management:    Induction: Intravenous  Airway Management Planned: Oral ETT  Additional Equipment:   Intra-op Plan:   Post-operative Plan:   Informed Consent: I have reviewed the patients History and Physical, chart, labs and discussed the procedure including the risks, benefits and alternatives for the proposed anesthesia with the patient or authorized representative who has indicated his/her understanding and acceptance.     Plan Discussed with: CRNA  Anesthesia Plan Comments:         Anesthesia Quick Evaluation

## 2015-04-21 NOTE — H&P (Signed)
[facility] Consultation  - H&P for Procedure (EBUS)  Date: 04/21/2015  MRN# [id number] Melvin Krueger 23-Oct-1950  Referring Physician: Dr. Thereasa Parkin is a 64 y.o. old male seen in consultation for evaluation of LUL lung mass  CC: hoarse voice   HPI:  Patient is a 64 year old male who was complaining of increased hoarseness of voice. A referral to ENT and subsequent CT scan of his next revealed a suspicious lung mass. Patient also has pain that radiates to his back. He otherwise feels well. He has no neurologic complaints, other than weakness on the right side, from a previous CVA. He denies any recent fevers or illnesses. He has a good appetite and denies weight loss. He denies any shortness of breath, cough, or hemoptysis, admits to chronic wheezes. He is a current smoker, and had his last cigarette lastnight. He has no nausea, vomiting, constipation, or diarrhea. He has no urinary complaints.  After being seen by Heme/Onc he was referred for bronchscopic evaluation of the lung mass. He has a PMX of CVA,CABG, recently on plavix, this is on hold for the procedure.   PMHX:   Past Medical History  Diagnosis Date  . Coronary artery disease     S/p CABG  . Hypertension   . Diabetes mellitus   . Stroke [facility])     CT head with old left occipital, old biparietal infacts. Has right weakness  . Anxiety   . Tobacco abuse   . Alcohol abuse   . PVD (peripheral vascular disease) (HCC)     CABG and s/p left CEA   . Cough   . GERD (gastroesophageal reflux disease)   . Seasonal allergies   . COPD (chronic obstructive pulmonary disease) ([place])   . Elevated lipids   . Depression   . Dizziness   . Muscle spasms of neck   . Myocardial infarction ([place])   . Stroke (cerebrum) (HCC)     Rt side  . Lower extremity edema    Surgical Hx:  Past Surgical History  Procedure Laterality Date  . Cardiac surgery      CABG  . Carotid endarterectomy      left side  .  Heart stents    . Stents lower extremities    . Carotid endarterectomy Left   . Coronary artery bypass graft      4 vessels >20 years   Family Hx:  History reviewed. No pertinent family history. Social Hx:   Social History  Substance Use Topics  . Smoking status: Current Every Day Smoker -- 1.50 packs/day for 50 years    Types: Cigarettes  . Smokeless tobacco: Never Used  . Alcohol Use: Yes     Comment: daily   Medication:   No current outpatient prescriptions on file.    Allergies:  Penicillins  ROS Review of Systems  Constitutional: Negative for fever, weight loss and malaise/fatigue.  HENT:   Hoarseness of voice.  Respiratory: Negative. Negative for cough, hemoptysis and shortness of breath.  Cardiovascular: Positive for chest pain.  Musculoskeletal: Positive for back pain.  Neurological: Negative. Negative for weakness.    As per HPI. Otherwise, a complete review of systems is negatve.  Physical Examination:   VS: BP 112/60 mmHg  Pulse 66  Temp(Src) 99.1 F (37.3 C) (Tympanic)  Resp 18  Ht '5\' 3"'$  (1.6 m)  Wt 160 lb (72.576 kg)  BMI 28.35 kg/m2  SpO2 96%  General: Well-developed, well-nourished, no acute distress. Eyes:  Pink conjunctiva, anicteric sclera. HEENT: Normocephalic, moist mucous membranes, clear oropharnyx. Lungs: diffuse wheezes, expiratory, good respiratory effort Heart: Regular rate and rhythm. No rubs, murmurs, or gallops. Abdomen: Soft, nontender, nondistended. No organomegaly noted, normoactive bowel sounds. Musculoskeletal: No edema, cyanosis, or clubbing. Neuro: Alert, answering all questions appropriately. Cranial nerves grossly intact. RUE weakness (3/5), RLE weakness (3/5) Skin: chest wall with spider nevi Psych: Normal affect. Lymphatics: No cervical, calvicular, axillary or inguinal LAD.    Rad results: see EMR ((The following images and results were reviewed by Dr. Stevenson Clinch on 04/21/2015). CT Chest 04/11/15      Assessment and Plan:64 yo M with LUL lung mass LUNG MASS - 2.8x3.5cm in the AP window - 3.4 x 2.5 cm in the left upper hilum - plan for bronchoscopy with biopsy, EBUS - hold plavix and any other anticoagulants, NSAIDS.  Orders for this visit: Orders Placed This Encounter  Procedures  . Glucose, capillary    Standing Status: Standing     Number of Occurrences: 1     Standing Expiration Date:   Marland Kitchen Vital signs    Standing Status: Standing     Number of Occurrences: 1     Standing Expiration Date:   Marland Kitchen Verify informed consent    Standing Status: Standing     Number of Occurrences: 1     Standing Expiration Date:   . Obtain Consent    Standing Status: Standing     Number of Occurrences: 1     Standing Expiration Date:     Order Specific Question:  Procedure    Answer:  Bronchoscopy with EBUS    Order Specific Question:  Surgeon    Answer:  Melvin Krueger    Order Specific Question:  Indication/Reason    Answer:  Left Upper  Lobe Lung mass  . Insert and maintain IV line    Nurse to order flush per facility specific protocol.    Standing Status: Standing     Number of Occurrences: 1     Standing Expiration Date:      Thank  you for the consultation and for allowing [facility] to assist in the care of your patient. Our recommendations are noted above.  Please contact us if we can be of further service.   Vilinda Boehringer, MD [facility] Office Number: [phone]  Note: This note was prepared with Dragon dictation along with smaller phrase technology. Any transcriptional errors that result from this process are unintentional.

## 2015-04-21 NOTE — Progress Notes (Signed)
meds discussed with Almyra Free at [facility] [phone])

## 2015-04-21 NOTE — Anesthesia Postprocedure Evaluation (Signed)
  Anesthesia Post-op Note  Patient: Melvin Krueger  Procedure(s) Performed: Procedure(s): ENDOBRONCHIAL ULTRASOUND (N/A) VIDEO BRONCHOSCOPY WITH FLUORO (N/A)  Anesthesia type:General  Patient location: PACU  Post pain: Pain level controlled  Post assessment: Post-op Vital signs reviewed, Patient's Cardiovascular Status Stable, Respiratory Function Stable, Patent Airway and No signs of Nausea or vomiting  Post vital signs: Reviewed and stable  Last Vitals:  Filed Vitals:   04/21/15 1234  BP: 110/66  Pulse: 98  Temp:   Resp:     Level of consciousness: awake, alert  and patient cooperative  Complications: No apparent anesthesia complications

## 2015-04-21 NOTE — Transfer of Care (Signed)
Immediate Anesthesia Transfer of Care Note  Patient: Melvin Krueger  Procedure(s) Performed: Procedure(s): ENDOBRONCHIAL ULTRASOUND (N/A) VIDEO BRONCHOSCOPY WITH FLUORO (N/A)  Patient Location: PACU  Anesthesia Type:General  Level of Consciousness: awake, alert  and oriented  Airway & Oxygen Therapy: Patient Spontanous Breathing and Patient connected to face mask oxygen  Post-op Assessment: Report given to RN and Post -op Vital signs reviewed and stable  Post vital signs: Reviewed and stable  Last Vitals:  Filed Vitals:   04/21/15 1021  BP: 146/76  Pulse: 92  Temp: 36.7 C  Resp: 20    Complications: No apparent anesthesia complications

## 2015-04-21 NOTE — Progress Notes (Signed)
Respiratory Therapy in for duoneb treatment as ordered by Dr Marcello Moores

## 2015-04-24 ENCOUNTER — Inpatient Hospital Stay (HOSPITAL_BASED_OUTPATIENT_CLINIC_OR_DEPARTMENT_OTHER): Payer: Medicare Other | Admitting: Oncology

## 2015-04-24 VITALS — BP 111/66 | HR 77 | Temp 96.7°F | Wt 160.1 lb

## 2015-04-24 DIAGNOSIS — F1010 Alcohol abuse, uncomplicated: Secondary | ICD-10-CM

## 2015-04-24 DIAGNOSIS — F17210 Nicotine dependence, cigarettes, uncomplicated: Secondary | ICD-10-CM

## 2015-04-24 DIAGNOSIS — R490 Dysphonia: Secondary | ICD-10-CM | POA: Diagnosis not present

## 2015-04-24 DIAGNOSIS — C3490 Malignant neoplasm of unspecified part of unspecified bronchus or lung: Secondary | ICD-10-CM

## 2015-04-24 DIAGNOSIS — C7801 Secondary malignant neoplasm of right lung: Secondary | ICD-10-CM | POA: Diagnosis not present

## 2015-04-24 DIAGNOSIS — Z79899 Other long term (current) drug therapy: Secondary | ICD-10-CM

## 2015-04-24 DIAGNOSIS — C7802 Secondary malignant neoplasm of left lung: Secondary | ICD-10-CM

## 2015-04-24 DIAGNOSIS — C3480 Malignant neoplasm of overlapping sites of unspecified bronchus and lung: Secondary | ICD-10-CM

## 2015-04-24 DIAGNOSIS — C3412 Malignant neoplasm of upper lobe, left bronchus or lung: Secondary | ICD-10-CM

## 2015-04-24 MED ORDER — OXYCODONE HCL 10 MG PO TABS: 10.0000 mg | ORAL_TABLET | Freq: Four times a day (QID) | ORAL | Status: AC | PRN

## 2015-04-27 DIAGNOSIS — C3492 Malignant neoplasm of unspecified part of left bronchus or lung: Secondary | ICD-10-CM | POA: Insufficient documentation

## 2015-04-27 NOTE — Progress Notes (Signed)
[facility]  [phone] Fax:([phone]) [phone]  ID: Mare Loan OB: 05-20-1951  MR#: [id number]  FOY#:[id number]  Patient Care Team: Lynnell Jude, MD as PCP - General (Family Medicine)  CHIEF COMPLAINT:  Chief Complaint  Patient presents with  . Follow-up    lung mass    INTERVAL HISTORY: Patient returns to clinic today to discuss his imaging and biopsy results. He continues to have hoarseness of voice with pain, but otherwise feels well.  He has no neurologic complaints. He denies any recent fevers or illnesses. He has a good appetite and denies weight loss. He denies any shortness of breath, cough, or hemoptysis. He has no nausea, vomiting, constipation, or diarrhea. He has no urinary complaints. Patient offers no further specific complaints.  REVIEW OF SYSTEMS:   Review of Systems  Constitutional: Negative for fever, weight loss and malaise/fatigue.  HENT:       Hoarseness of voice.  Respiratory: Negative.  Negative for cough, hemoptysis and shortness of breath.   Cardiovascular: Positive for chest pain.  Musculoskeletal: Positive for back pain.  Neurological: Negative.  Negative for weakness.    As per HPI. Otherwise, a complete review of systems is negatve.  PAST MEDICAL HISTORY: Past Medical History  Diagnosis Date  . Coronary artery disease     S/p CABG  . Hypertension   . Diabetes mellitus   . Stroke [facility])     CT head with old left occipital, old biparietal infacts. Has right weakness  . Anxiety   . Tobacco abuse   . Alcohol abuse   . PVD (peripheral vascular disease) (HCC)     CABG and s/p left CEA   . Cough   . GERD (gastroesophageal reflux disease)   . Seasonal allergies   . COPD (chronic obstructive pulmonary disease) ([place])   . Elevated lipids   . Depression   . Dizziness   . Muscle spasms of neck   . Myocardial infarction ([place])   . Stroke (cerebrum) (HCC)     Rt side  . Lower extremity edema     PAST SURGICAL  HISTORY: Past Surgical History  Procedure Laterality Date  . Cardiac surgery      CABG  . Carotid endarterectomy      left side  . Heart stents    . Stents lower extremities    . Carotid endarterectomy Left   . Coronary artery bypass graft      4 vessels >20 years  . Endobronchial ultrasound N/A 04/21/2015    Procedure: ENDOBRONCHIAL ULTRASOUND;  Surgeon: Vilinda Boehringer, MD;  Location: [facility] ORS;  Service: Cardiopulmonary;  Laterality: N/A;  . Video bronchoscopy N/A 04/21/2015    Procedure: VIDEO BRONCHOSCOPY WITH FLUORO;  Surgeon: Vilinda Boehringer, MD;  Location: [facility] ORS;  Service: Cardiopulmonary;  Laterality: N/A;    FAMILY HISTORY No family history on file.     ADVANCED DIRECTIVES:    HEALTH MAINTENANCE: Social History  Substance Use Topics  . Smoking status: Current Every Day Smoker -- 1.50 packs/day for 50 years    Types: Cigarettes  . Smokeless tobacco: Never Used  . Alcohol Use: Yes     Comment: daily     Colonoscopy:  PAP:  Bone density:  Lipid panel:  Allergies  Allergen Reactions  . Penicillins Rash    Current Outpatient Prescriptions  Medication Sig Dispense Refill  . acetaminophen (TYLENOL) 500 MG tablet Take 500 mg by mouth every 6 (six) hours as needed. Pain    .  ALPRAZolam (XANAX) 0.25 MG tablet     . aspirin EC 81 MG tablet Take by mouth.    Marland Kitchen atorvastatin (LIPITOR) 40 MG tablet     . busPIRone (BUSPAR) 15 MG tablet Take by mouth.    . cetirizine (ZYRTEC) 10 MG tablet Take 10 mg by mouth daily.    . clopidogrel (PLAVIX) 75 MG tablet Take by mouth.    . dicyclomine (BENTYL) 10 MG capsule Take 10 mg by mouth 3 (three) times daily as needed for spasms.    . finasteride (PROSCAR) 5 MG tablet Take by mouth.    . folic acid (FOLVITE) 161 MCG tablet Take by mouth.    . furosemide (LASIX) 20 MG tablet     . HYDROcodone-acetaminophen (NORCO/VICODIN) 5-325 MG tablet Take by mouth.    . Liniments (SALONPAS PAIN RELIEF PATCH) PADS Apply 1 each topically  daily.    Marland Kitchen lisinopril (PRINIVIL,ZESTRIL) 10 MG tablet Take by mouth.    . loperamide (IMODIUM) 2 MG capsule Take 2 mg by mouth as needed for diarrhea or loose stools.    . meclizine (ANTIVERT) 25 MG tablet     . metFORMIN (GLUCOPHAGE) 500 MG tablet Take 500 mg by mouth 2 (two) times daily with a meal.    . metFORMIN (GLUCOPHAGE-XR) 500 MG 24 hr tablet     . metoprolol tartrate (LOPRESSOR) 25 MG tablet     . ondansetron (ZOFRAN) 8 MG tablet Take by mouth.    . pantoprazole (PROTONIX) 40 MG tablet     . PARoxetine (PAXIL) 40 MG tablet Take by mouth.    . polyethylene glycol powder (GLYCOLAX/MIRALAX) powder Take by mouth.    . Promethazine HCl (PHENERGAN IJ) Inject 1 mL as directed 2 (two) times daily as needed.    . tamsulosin (FLOMAX) 0.4 MG CAPS capsule     . tiotropium (SPIRIVA) 18 MCG inhalation capsule Place into inhaler and inhale.    Marland Kitchen tiZANidine (ZANAFLEX) 4 MG tablet Take by mouth.    . traZODone (DESYREL) 100 MG tablet     . Vitamin D, Ergocalciferol, (DRISDOL) 50000 UNITS CAPS capsule Take 50,000 Units by mouth every 30 (thirty) days.     Marland Kitchen HYDROcodone-acetaminophen (NORCO/VICODIN) 5-325 MG tablet Take 1 tablet by mouth 3 (three) times daily.    Marland Kitchen lamoTRIgine (LAMICTAL) 200 MG tablet Take 1 tablet (200 mg total) by mouth 2 (two) times daily. 60 tablet 11  . Oxycodone HCl 10 MG TABS Take 1 tablet (10 mg total) by mouth every 6 (six) hours as needed (moderate pain). 120 tablet 0   No current facility-administered medications for this visit.    OBJECTIVE: Filed Vitals:   04/24/15 0942  BP: 111/66  Pulse: 77  Temp: 96.7 F (35.9 C)     Body mass index is 28.36 kg/(m^2).    ECOG FS:1 - Symptomatic but completely ambulatory  General: Well-developed, well-nourished, no acute distress. Eyes: Pink conjunctiva, anicteric sclera. Lungs: Clear to auscultation bilaterally. Heart: Regular rate and rhythm. No rubs, murmurs, or gallops. Abdomen: Soft, nontender, nondistended. No  organomegaly noted, normoactive bowel sounds. Musculoskeletal: No edema, cyanosis, or clubbing. Neuro: Alert, answering all questions appropriately. Cranial nerves grossly intact. Skin: No rashes or petechiae noted. Psych: Normal affect.   LAB RESULTS:  Lab Results  Component Value Date   NA 128* 04/08/2015   K 3.8 04/08/2015   CL 92* 04/08/2015   CO2 28 04/08/2015   GLUCOSE 174* 04/08/2015   BUN 18 04/08/2015   CREATININE  0.98 04/08/2015   CALCIUM 8.5* 04/08/2015   PROT 7.3 04/08/2015   ALBUMIN 4.0 04/08/2015   AST 20 04/08/2015   ALT 12* 04/08/2015   ALKPHOS 87 04/08/2015   BILITOT 0.7 04/08/2015   GFRNONAA >60 04/08/2015   GFRAA >60 04/08/2015    Lab Results  Component Value Date   WBC 8.4 04/08/2015   NEUTROABS 6.4 04/08/2015   HGB 13.4 04/08/2015   HCT 39.8* 04/08/2015   MCV 85.6 04/08/2015   PLT 182 04/08/2015     STUDIES: Ct Chest W Contrast  04/11/2015  CLINICAL DATA:  Initial encounter for hoarseness and right shoulder pain for 3 months. EXAM: CT CHEST, ABDOMEN, AND PELVIS WITH CONTRAST TECHNIQUE: Multidetector CT imaging of the chest, abdomen and pelvis was performed following the standard protocol during bolus administration of intravenous contrast. CONTRAST:  124m OMNIPAQUE IOHEXOL 300 MG/ML  SOLN COMPARISON:  Abdomen and pelvis CT from 06/02/2014. FINDINGS: CT CHEST FINDINGS Mediastinum/Lymph Nodes: There is no axillary lymphadenopathy. 17 mm short axis right paratracheal lymph node is associated with 2.8 x 3.5 cm irregular soft tissue mass in the AP window. 3.4 x 2.5 cm abnormal soft tissue mass is identified in the upper left hilum. 1.7 cm short axis precarinal lymph node evident. The heart size is normal. No pericardial effusion. Patient is status post CABG. Mild circumferential wall thickening is seen in the mid and distal esophagus. Lungs/Pleura: Multiple bilateral pulmonary nodules are evident. 2.3 cm irregular nodule is seen in the left apex (image 9  series 5). Multiple other bilateral pulmonary nodules range in size from several mm up to a right upper lobe 12 mm lesion seen on image 12. Musculoskeletal: Bone windows reveal no worrisome lytic or sclerotic osseous lesions. T12 compression fracture is stable in the interval. CT ABDOMEN PELVIS FINDINGS Hepatobiliary: No focal abnormality within the liver parenchyma. Calcified stones are seen in the gallbladder measuring up to 13 mm diameter. No intrahepatic or extrahepatic biliary dilation. Pancreas: No focal mass lesion. No dilatation of the main duct. No intraparenchymal cyst. No peripancreatic edema. Spleen: No splenomegaly. No focal mass lesion. Adrenals/Urinary Tract: No adrenal nodule or mass. Kidneys are unremarkable. No evidence for hydroureter. The urinary bladder appears normal for the degree of distention. Stomach/Bowel: Stomach is nondistended. No gastric wall thickening. No evidence of outlet obstruction. Large duodenum diverticulum noted. No small bowel wall thickening. No small bowel dilatation. The terminal ileum is normal. The appendix is normal. Diverticular changes are noted in the left colon without evidence of diverticulitis. Vascular/Lymphatic: There is abdominal aortic atherosclerosis without aneurysm. There is no gastrohepatic or hepatoduodenal ligament lymphadenopathy. No intraperitoneal or retroperitoneal lymphadenopy. Reproductive: The prostate gland and seminal vesicles have normal imaging features. Other: No intraperitoneal free fluid. Musculoskeletal: Bone windows reveal no worrisome lytic or sclerotic osseous lesions. IMPRESSION: 1. Mediastinal and hilar lymphadenopathy associated with bilateral numerous pulmonary nodules, imaging features consistent with metastatic disease. Lung Primary would be a consideration, with the largest lung nodule measuring 2.3 cm in the left upper lobe. Mild circumferential wall thickening is noted in the distal esophagus, stable in the interval, but  esophageal neoplasm would also be a consideration. PET-CT may prove helpful to further evaluate. 2. No evidence for primary malignancy or metastatic disease in the abdomen or pelvis. 3. Cholelithiasis. 4. Atherosclerosis of the abdominal aorta. 5. Large duodenum diverticulum with left-sided colonic diverticulosis. 6. Stable T12 compression fracture. Electronically Signed   By: EMisty StanleyM.D.   On: 04/11/2015 12:26   Ct Abdomen  Pelvis W Contrast  04/11/2015  CLINICAL DATA:  Initial encounter for hoarseness and right shoulder pain for 3 months. EXAM: CT CHEST, ABDOMEN, AND PELVIS WITH CONTRAST TECHNIQUE: Multidetector CT imaging of the chest, abdomen and pelvis was performed following the standard protocol during bolus administration of intravenous contrast. CONTRAST:  119m OMNIPAQUE IOHEXOL 300 MG/ML  SOLN COMPARISON:  Abdomen and pelvis CT from 06/02/2014. FINDINGS: CT CHEST FINDINGS Mediastinum/Lymph Nodes: There is no axillary lymphadenopathy. 17 mm short axis right paratracheal lymph node is associated with 2.8 x 3.5 cm irregular soft tissue mass in the AP window. 3.4 x 2.5 cm abnormal soft tissue mass is identified in the upper left hilum. 1.7 cm short axis precarinal lymph node evident. The heart size is normal. No pericardial effusion. Patient is status post CABG. Mild circumferential wall thickening is seen in the mid and distal esophagus. Lungs/Pleura: Multiple bilateral pulmonary nodules are evident. 2.3 cm irregular nodule is seen in the left apex (image 9 series 5). Multiple other bilateral pulmonary nodules range in size from several mm up to a right upper lobe 12 mm lesion seen on image 12. Musculoskeletal: Bone windows reveal no worrisome lytic or sclerotic osseous lesions. T12 compression fracture is stable in the interval. CT ABDOMEN PELVIS FINDINGS Hepatobiliary: No focal abnormality within the liver parenchyma. Calcified stones are seen in the gallbladder measuring up to 13 mm diameter. No  intrahepatic or extrahepatic biliary dilation. Pancreas: No focal mass lesion. No dilatation of the main duct. No intraparenchymal cyst. No peripancreatic edema. Spleen: No splenomegaly. No focal mass lesion. Adrenals/Urinary Tract: No adrenal nodule or mass. Kidneys are unremarkable. No evidence for hydroureter. The urinary bladder appears normal for the degree of distention. Stomach/Bowel: Stomach is nondistended. No gastric wall thickening. No evidence of outlet obstruction. Large duodenum diverticulum noted. No small bowel wall thickening. No small bowel dilatation. The terminal ileum is normal. The appendix is normal. Diverticular changes are noted in the left colon without evidence of diverticulitis. Vascular/Lymphatic: There is abdominal aortic atherosclerosis without aneurysm. There is no gastrohepatic or hepatoduodenal ligament lymphadenopathy. No intraperitoneal or retroperitoneal lymphadenopy. Reproductive: The prostate gland and seminal vesicles have normal imaging features. Other: No intraperitoneal free fluid. Musculoskeletal: Bone windows reveal no worrisome lytic or sclerotic osseous lesions. IMPRESSION: 1. Mediastinal and hilar lymphadenopathy associated with bilateral numerous pulmonary nodules, imaging features consistent with metastatic disease. Lung Primary would be a consideration, with the largest lung nodule measuring 2.3 cm in the left upper lobe. Mild circumferential wall thickening is noted in the distal esophagus, stable in the interval, but esophageal neoplasm would also be a consideration. PET-CT may prove helpful to further evaluate. 2. No evidence for primary malignancy or metastatic disease in the abdomen or pelvis. 3. Cholelithiasis. 4. Atherosclerosis of the abdominal aorta. 5. Large duodenum diverticulum with left-sided colonic diverticulosis. 6. Stable T12 compression fracture. Electronically Signed   By: EMisty StanleyM.D.   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  PLAN:    1. Lung cancer: Imaging and pathology results reviewed independently. Patient has stage IV disease even has bilateral pulmonary metastasis. We will get a PET scan and MRI of the brain to complete the staging workup. Patient has expressed interest in pursuing palliative chemotherapy and likely will require port placement prior to initiation of treatment. Return to clinic in approximately 2 weeks for further evaluation and treatment planning. 2. Hoarseness of voice: Likely secondary to underlying  malignancy as above. 3. Pain: Continue Norco as prescribed. 4. Hyperglycemia: Continue current diabetic medications. Monitor during chemotherapy since patient will likely receive dexamethasone as a premedication.  Patient expressed understanding and was in agreement with this plan. He also understands that He can call clinic at any time with any questions, concerns, or complaints.    Lloyd Huger, MD   04/27/2015 10:10 PM

## 2015-04-29 ENCOUNTER — Other Ambulatory Visit: Payer: Self-pay | Admitting: Vascular Surgery

## 2015-04-30 ENCOUNTER — Encounter
Admission: RE | Disposition: A | Discharge status: Home / Self Care | Payer: Self-pay | Source: Ambulatory Visit | Attending: Vascular Surgery

## 2015-04-30 ENCOUNTER — Ambulatory Visit
Admission: RE | Admit: 2015-04-30 | Discharge: 2015-04-30 | Disposition: A | Discharge status: Home / Self Care | Payer: Medicare Other | Source: Ambulatory Visit | Attending: Vascular Surgery | Admitting: Vascular Surgery

## 2015-04-30 ENCOUNTER — Encounter: Payer: Self-pay | Admitting: *Deleted

## 2015-04-30 DIAGNOSIS — Z8673 Personal history of transient ischemic attack (TIA), and cerebral infarction without residual deficits: Secondary | ICD-10-CM | POA: Insufficient documentation

## 2015-04-30 DIAGNOSIS — F17210 Nicotine dependence, cigarettes, uncomplicated: Secondary | ICD-10-CM | POA: Insufficient documentation

## 2015-04-30 DIAGNOSIS — I252 Old myocardial infarction: Secondary | ICD-10-CM | POA: Diagnosis not present

## 2015-04-30 DIAGNOSIS — K219 Gastro-esophageal reflux disease without esophagitis: Secondary | ICD-10-CM | POA: Diagnosis not present

## 2015-04-30 DIAGNOSIS — Z79899 Other long term (current) drug therapy: Secondary | ICD-10-CM | POA: Insufficient documentation

## 2015-04-30 DIAGNOSIS — I25810 Atherosclerosis of coronary artery bypass graft(s) without angina pectoris: Secondary | ICD-10-CM | POA: Insufficient documentation

## 2015-04-30 DIAGNOSIS — Z7982 Long term (current) use of aspirin: Secondary | ICD-10-CM | POA: Insufficient documentation

## 2015-04-30 DIAGNOSIS — J449 Chronic obstructive pulmonary disease, unspecified: Secondary | ICD-10-CM | POA: Diagnosis not present

## 2015-04-30 DIAGNOSIS — Z7984 Long term (current) use of oral hypoglycemic drugs: Secondary | ICD-10-CM | POA: Insufficient documentation

## 2015-04-30 DIAGNOSIS — I10 Essential (primary) hypertension: Secondary | ICD-10-CM | POA: Diagnosis not present

## 2015-04-30 DIAGNOSIS — E119 Type 2 diabetes mellitus without complications: Secondary | ICD-10-CM | POA: Insufficient documentation

## 2015-04-30 DIAGNOSIS — C3490 Malignant neoplasm of unspecified part of unspecified bronchus or lung: Secondary | ICD-10-CM | POA: Insufficient documentation

## 2015-04-30 HISTORY — DX: Malignant (primary) neoplasm, unspecified: C80.1

## 2015-04-30 HISTORY — PX: PERIPHERAL VASCULAR CATHETERIZATION: SHX172C

## 2015-04-30 HISTORY — DX: Reserved for inherently not codable concepts without codable children: IMO0001

## 2015-04-30 LAB — CYTOLOGY - NON PAP

## 2015-04-30 LAB — GLUCOSE, CAPILLARY: Glucose-Capillary: 117 mg/dL — ABNORMAL HIGH (ref 65–99)

## 2015-04-30 SURGERY — PORTA CATH INSERTION
Anesthesia: Moderate Sedation

## 2015-04-30 MED ORDER — SODIUM CHLORIDE 0.9 % IV SOLN
INTRAVENOUS | Status: DC
  Administered 2015-04-30: 09:00:00 via INTRAVENOUS

## 2015-04-30 MED ORDER — MIDAZOLAM HCL 5 MG/5ML IJ SOLN
INTRAMUSCULAR | Status: AC
  Filled 2015-04-30: qty 5

## 2015-04-30 MED ORDER — CLINDAMYCIN PHOSPHATE 300 MG/50ML IV SOLN
INTRAVENOUS | Status: AC
  Administered 2015-04-30: 11:00:00
  Filled 2015-04-30: qty 50

## 2015-04-30 MED ORDER — FENTANYL CITRATE (PF) 100 MCG/2ML IJ SOLN
INTRAMUSCULAR | Status: AC
  Filled 2015-04-30: qty 2

## 2015-04-30 MED ORDER — LIDOCAINE-EPINEPHRINE (PF) 1 %-1:200000 IJ SOLN
INTRAMUSCULAR | Status: DC | PRN
  Administered 2015-04-30: 10 mL via INTRADERMAL

## 2015-04-30 MED ORDER — LIDOCAINE-EPINEPHRINE (PF) 1 %-1:200000 IJ SOLN
INTRAMUSCULAR | Status: AC
  Filled 2015-04-30: qty 30

## 2015-04-30 MED ORDER — MIDAZOLAM HCL 2 MG/2ML IJ SOLN
INTRAMUSCULAR | Status: DC | PRN
  Administered 2015-04-30: 2 mg via INTRAVENOUS

## 2015-04-30 MED ORDER — FENTANYL CITRATE (PF) 100 MCG/2ML IJ SOLN
INTRAMUSCULAR | Status: DC | PRN
  Administered 2015-04-30: 50 ug via INTRAVENOUS

## 2015-04-30 MED ORDER — CLINDAMYCIN PHOSPHATE 300 MG/50ML IV SOLN: 300.0000 mg | Freq: Once | INTRAVENOUS | Status: DC

## 2015-04-30 SURGICAL SUPPLY — 10 items
BAG DECANTER STRL (MISCELLANEOUS) ×3
DRAPE INCISE IOBAN 66X45 STRL (DRAPES) ×3
PACK ANGIOGRAPHY (CUSTOM PROCEDURE TRAY) ×3
PORTACATH POWER 8F (Port) ×3 IMPLANT
PREP CHG 10.5 TEAL (MISCELLANEOUS) ×3
SET INTRO CAPELLA COAXIAL (SET/KITS/TRAYS/PACK) ×3
SUT MNCRL AB 4-0 PS2 18 (SUTURE) ×3
SUT PROLENE 0 CT 1 30 (SUTURE) ×3
SUTURE VIC 3-0 (SUTURE) ×3
TOWEL OR 17X26 4PK STRL BLUE (TOWEL DISPOSABLE) ×3

## 2015-04-30 NOTE — Op Note (Signed)
OPERATIVE NOTE   PROCEDURE: 1. Placement of a right internal jugular Infuse-a-Port  PRE-OPERATIVE DIAGNOSIS: Lung carcinoma  POST-OPERATIVE DIAGNOSIS: Same  SURGEON: Katha Cabal M.D.  ANESTHESIA: Conscious sedation combined with 1% lidocaine with epinephrine  ESTIMATED BLOOD LOSS: Minimal   FINDING(S): 1.  Patent vein  SPECIMEN(S): None  INDICATIONS:   Melvin Krueger is a 64 y.o. male who presents with lung carcinoma he therefore requires adequate IV access for his chemotherapy. Risks and benefits are reviewed patient agrees to proceed with port placement.  DESCRIPTION: After obtaining full informed written consent, the patient was brought back to the special procedure suite and placed in the supine position. The patient's right neck and chest wall are prepped and draped in sterile fashion. Appropriate timeout was called.  Ultrasound is placed in a sterile sleeve, ultrasound is utilized to avoid vascular injury as well as secondary to lack of appropriate landmarks. The right internal jugular vein is identified. It is echolucent and homogeneous as well as easily compressible indicating patency. 1% lidocaine is infiltrated into the soft tissue at the base of the neck as well as on the chest wall.  Under direct ultrasound visualization Seldinger needle is inserted into the right internal jugular vein. J-wire is advanced under fluoroscopic guidance. A small counterincision was created at the wire insertion site. A transverse incision is created 2 fingerbreadths below the scapula and a pocket is fashioned using both blunt and sharp dissection. The pocket is tested for appropriate size with the hub of the Infuse-a-Port. The tunneling device is then used to pull the intravascular portion of the catheter from the pocket to the neck counterincision.  Dilator and peel-away sheath were then inserted over the wire and the wire is removed. Catheter is then advanced into the venous system  without difficulty. Peel-away sheath was then removed.  Catheter is then positioned under fluoroscopic guidance at the atrial caval junction. It is then transected connected to the hub and the hope is slipped into the subcutaneous pocket on the chest wall. The hub was then accessed percutaneously and aspirates easily and flushes well and is flushed with 30 cc of heparinized saline. The pocket incision is then closed in layers using interrupted 3-0 Vicryl for the subcutaneous tissues and 4-0 Monocryl subcuticular for skin closure. Dermabond is applied. The neck counterincision was closed with 4-0 Monocryl subcuticular and Dermabond as well.  The patient tolerated the procedure well and there were no immediate complications.  COMPLICATIONS: None  CONDITION: Unchanged  Katha Cabal M.D. [facility] Office: [phone]   04/30/2015, 12:05 PM

## 2015-04-30 NOTE — H&P (Signed)
[facility] History & Physical Update  The patient was interviewed and re-examined.  The patient's previous History and Physical has been reviewed and is unchanged.  There is no change in the plan of care. We plan to proceed with the scheduled procedure.  Trianna Lupien, Dolores Lory, MD  04/30/2015, 10:42 AM

## 2015-04-30 NOTE — Discharge Instructions (Signed)

## 2015-05-05 ENCOUNTER — Ambulatory Visit: Payer: Medicare Other | Admitting: Oncology

## 2015-05-05 ENCOUNTER — Ambulatory Visit
Admission: RE | Admit: 2015-05-05 | Discharge: 2015-05-05 | Disposition: A | Discharge status: Home / Self Care | Payer: Medicare Other | Source: Ambulatory Visit | Attending: Oncology | Admitting: Oncology

## 2015-05-05 DIAGNOSIS — R935 Abnormal findings on diagnostic imaging of other abdominal regions, including retroperitoneum: Secondary | ICD-10-CM | POA: Diagnosis not present

## 2015-05-05 DIAGNOSIS — Z0189 Encounter for other specified special examinations: Secondary | ICD-10-CM | POA: Insufficient documentation

## 2015-05-05 DIAGNOSIS — C3480 Malignant neoplasm of overlapping sites of unspecified bronchus and lung: Secondary | ICD-10-CM | POA: Diagnosis present

## 2015-05-05 DIAGNOSIS — C7951 Secondary malignant neoplasm of bone: Secondary | ICD-10-CM | POA: Insufficient documentation

## 2015-05-05 LAB — GLUCOSE, CAPILLARY: GLUCOSE-CAPILLARY: 118 mg/dL — AB (ref 65–99)

## 2015-05-05 MED ORDER — FLUDEOXYGLUCOSE F - 18 (FDG) INJECTION
11.6800 | Freq: Once | INTRAVENOUS | Status: AC | PRN
  Administered 2015-05-05: 11.68 via INTRAVENOUS

## 2015-05-06 NOTE — Anesthesia Postprocedure Evaluation (Signed)
Anesthesia Post Note  Patient: Melvin Krueger  Procedure(s) Performed: Procedure(s) (LRB): ENDOBRONCHIAL ULTRASOUND (N/A) VIDEO BRONCHOSCOPY WITH FLUORO (N/A)  Patient location during evaluation: PACU Anesthesia Type: General Pain management: pain level controlled Respiratory status: spontaneous breathing Cardiovascular status: stable Anesthesia complication: Sats low - discussed with Dr. Stevenson Clinch.    Last Vitals:  Filed Vitals:   04/21/15 1200 04/21/15 1234  BP: 138/65 110/66  Pulse: 97 98  Temp: 36.1 C   Resp: 14     Last Pain:  Filed Vitals:   04/22/15 0902  PainSc: 0-No pain                 [name] S

## 2015-05-06 NOTE — Addendum Note (Signed)
Addendum  created 05/06/15 1302 by Gunnar Bulla, MD   Modules edited: Clinical Notes, Notes Section   Clinical Notes:  File: [id number]   Notes Section:  Delete: [id number]

## 2015-05-08 ENCOUNTER — Telehealth: Payer: Self-pay | Admitting: *Deleted

## 2015-05-08 ENCOUNTER — Encounter: Payer: Self-pay | Admitting: Oncology

## 2015-05-08 NOTE — Telephone Encounter (Signed)
Reports that they are going to go ahead with Hospice Referral this afternoon

## 2015-05-14 ENCOUNTER — Ambulatory Visit: Payer: Medicare Other

## 2015-05-19 ENCOUNTER — Ambulatory Visit: Payer: Medicare Other | Admitting: Oncology

## 2015-05-21 NOTE — Op Note (Signed)
[facility] Patient Name: Melvin Krueger Procedure Date: 04/21/2015 9:24 AM MRN: [id number] Account #: [id number] Admit Type: Outpatient Room: OR Note Status: Finalized Attending MD: Vilinda Boehringer,  Procedure:         Bronchoscopy Indications:       Left upper lobe mass, Left hilar mass Providers:         Matisyn Cabeza, Sullivan Lone, Technician (Technician) Referring MD:       Medicines:         See the Anesthesia note for documentation of the                     administered medications Complications:     No immediate complications. Estimated blood loss: Minimal Procedure:         Pre-Anesthesia Assessment:                    - A History and Physical has been performed. The patient's                     medications, allergies and sensitivities have been                     reviewed.                    After obtaining informed consent, the bronchoscope was                     passed under direct vision. Throughout the procedure, the                     patient's blood pressure, pulse, and oxygen saturations                     were monitored continuously. the Bronchoscope was                     introduced through the mouth, via the endotracheal tube                     (the patient was intubated for the procedure) and advanced                     to the trachea. the Bronchoscope Olympus [id number]                     [id number] was introduced through the mouth, via the                     endotracheal tube (the patient was intubated for the                     procedure) and advanced to the trachea. The procedure was                     accomplished without difficulty. The patient tolerated the                     procedure well. The total duration of the procedure was 35                     minutes. Findings:      An endobronchial ultrasound endoscope was utilized to systematically       examine in the left paratracheal (AP window) area in order  to assist   with guiding the biopsy needle and assist with fine needle aspiration.      Notable secretions were found in the left upper lobe. They were not       obstructing the airway.      The nasopharynx/oropharynx appears normal. The larynx appears normal.       The vocal cords are normal and move normally with phonation and       breathing. The subglottic space is normal. The trachea is of normal       caliber. The carina is sharp. The tracheobronchial tree of the right       lung was examined to at least the first subsegmental level. Bronchial       mucosa and anatomy in the right lung are normal; there are no       endobronchial lesions, and no secretions.      Brushings were obtained in the superior segment of the left lower lobe       and sent for cell count, bacterial culture, viral smears & culture, and       fungal & AFB analysis and cytology. One sample was obtained.      Bronchoalveolar lavage was performed in the lingula of the lung and sent       for cell count, bacterial culture, viral smears & culture, and fungal &       AFB analysis and cytology. 40 mL of fluid were instilled. 20 mL were       returned. The return was cloudy. There were no mucoid plugs in the       return fluid.      An endobronchial ultrasound endoscope was utilized to systematically       examine in the left paratracheal (AP window) area in order to assist       with guiding the biopsy needle and assist with fine needle aspiration.      Other finding - LLL superior segment with erythematous mucosa and       partially extrinsic compression of the orifice (~40%).      Procedures performed - Brushing of LLL superior segment x 3, FNA using       ebus of 4L x 5, BAL for cytology of LUL. Impression:        - Endobronchial ultrasound was performed.                    - No specimens collected. Recommendation:    - Await biopsy results. [name],  05/21/2015 7:29:02 AM Number of Addenda: 0 Note Initiated On:  04/21/2015 9:24 AM      [facility]

## 2015-06-08 DEATH — deceased

## 2016-08-24 IMAGING — CT NM PET TUM IMG INITIAL (PI) SKULL BASE T - THIGH
1 of 10 series · 1 of 25 positions shown · non-contrast
Comparison: 04/11/2015

CLINICAL DATA: Initial treatment strategy for lung nodules..

EXAM:
NUCLEAR MEDICINE PET SKULL BASE TO THIGH
TECHNIQUE: 11.6 mCi F-18 FDG was injected intravenously. Full-ring PET imaging
was performed from the skull base to thigh after the radiotracer. CT
data was obtained and used for attenuation correction and anatomic
localization.
FASTING BLOOD GLUCOSE:  Value: 118 mg/dl

[Series 3: ct wb 5.0 b30f · axial · 5.0mm · 0.98mm/px · 1 of 290 slices shown]
[im 290/290  brain]
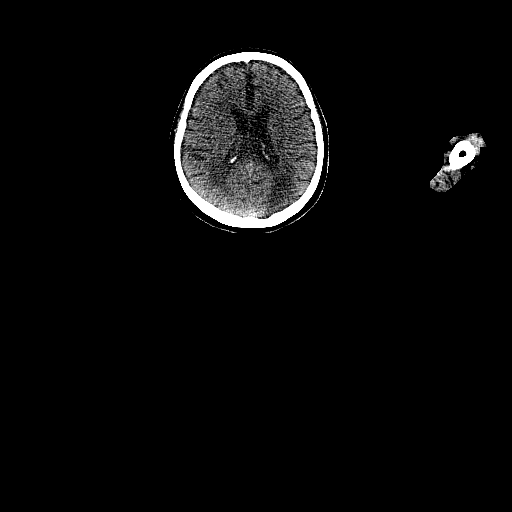

[1 of 25 positions shown; findings below may reference images not displayed]

FINDINGS: NECK

No hypermetabolic lymph nodes in the neck.

CHEST

Hypermetabolic nodule in the LEFT upper lobe measures 14 mm (image
74, series 3) with SUV max equal 9.2. Hypermetabolic RIGHT upper
lobe pulmonary nodule measuring 12 mm on image 75, series 3 with
equal metabolic activity. Smaller pulmonary nodules at the lung
bases with associated metabolic activity.

Hypermetabolic LEFT hilar mass with SUV max 8.6. This mass measures
3.2 cm on image 86, series 3. Multiple hypermetabolic mediastinal
lymph nodes. For example 20 mm short axis RIGHT lower paratracheal
lymph node with SUV max equal 19.

ABDOMEN/PELVIS

There is moderate metabolic activity within the gastric fundus
without mass lesion. There is some thickening on comparison contrast
CT. Single focus of metabolic activity adjacent to the fourth
portion the duodenum (image 150, series 3) likely represents a
metastatic lymph node (image 150 fused data set).

There is a rim of hypermetabolic thickening associated with the
distal rectum (image 233 series 3 with intense metabolic activity.

No abnormal metabolic activity the liver.

SKELETON

Multiple sites of skeletal metastasis within the ribs, spine and
pelvis. Subtle changes on CT exam. For example small lytic lesion in
the pedicle of the T10 vertebral body on the LEFT with SUV max 10.1.

New pathologic fracture in the posterior RIGHT eleventh rib.

Lesions within LEFT and RIGHT iliac bones with RIGHT iliac lesion
SUV max 10.9. Metastatic lesions are too numerous to count.
IMPRESSION: 1. Bilateral hypermetabolic pulmonary nodules with differential
including bronchogenic carcinoma with metastasis versus pulmonary
metastasis from abdominal carcinoma. Favor bronchogenic carcinoma.
2. Hypermetabolic LEFT hilar mass and metastatic mediastinal lymph
nodes.
3. Widespread hypermetabolic skeletal metastasis. New pathologic
fracture of posterior RIGHT rib.
4. Circumferential thickening of the distal rectum may be
inflammatory. Recommend correlation with biopsy results.
5. Metabolic activity within the gastric fundus. Cannot exclude
gastric carcinoma.
6. Small hypermetabolic lesion adjacent to the fourth portion the
duodenum likely represents a metastatic lymph node.
